# Patient Record
Sex: Female | Born: 1990 | Race: Black or African American | Hispanic: No | Marital: Single | Smoking: Never smoker
Health system: Southern US, Community
[De-identification: ages and names within clinical notes are randomized; demographics above are authoritative.]

## PROBLEM LIST (undated history)

## (undated) ENCOUNTER — Inpatient Hospital Stay (HOSPITAL_COMMUNITY): Payer: Self-pay

## (undated) DIAGNOSIS — A64 Unspecified sexually transmitted disease: Secondary | ICD-10-CM

## (undated) DIAGNOSIS — R87629 Unspecified abnormal cytological findings in specimens from vagina: Secondary | ICD-10-CM

## (undated) DIAGNOSIS — G43909 Migraine, unspecified, not intractable, without status migrainosus: Secondary | ICD-10-CM

## (undated) DIAGNOSIS — A5901 Trichomonal vulvovaginitis: Secondary | ICD-10-CM

## (undated) HISTORY — DX: Migraine, unspecified, not intractable, without status migrainosus: G43.909

## (undated) HISTORY — DX: Unspecified sexually transmitted disease: A64

## (undated) HISTORY — DX: Trichomonal vulvovaginitis: A59.01

---
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
## 2010-03-10 ENCOUNTER — Emergency Department (HOSPITAL_COMMUNITY): Admission: EM | Admit: 2010-03-10 | Discharge: 2010-03-10 | Payer: Self-pay | Admitting: Emergency Medicine

## 2010-07-14 LAB — POCT I-STAT, CHEM 8
BUN: 13 mg/dL (ref 6–23)
Calcium, Ion: 1.08 mmol/L — ABNORMAL LOW (ref 1.12–1.32)
Chloride: 100 mEq/L (ref 96–112)
Creatinine, Ser: 0.9 mg/dL (ref 0.4–1.2)
Glucose, Bld: 82 mg/dL (ref 70–99)
HCT: 46 % (ref 36.0–46.0)
Hemoglobin: 15.6 g/dL — ABNORMAL HIGH (ref 12.0–15.0)
Potassium: 3.6 mEq/L (ref 3.5–5.1)
Sodium: 136 mEq/L (ref 135–145)
TCO2: 26 mmol/L (ref 0–100)

## 2010-07-14 LAB — MONONUCLEOSIS SCREEN: Mono Screen: NEGATIVE

## 2010-07-14 LAB — DIFFERENTIAL
Basophils Absolute: 0 10*3/uL (ref 0.0–0.1)
Basophils Relative: 0 % (ref 0–1)
Eosinophils Absolute: 0 10*3/uL (ref 0.0–0.7)
Eosinophils Relative: 0 % (ref 0–5)
Lymphocytes Relative: 22 % (ref 12–46)
Lymphs Abs: 1.4 10*3/uL (ref 0.7–4.0)
Monocytes Absolute: 1 10*3/uL (ref 0.1–1.0)
Monocytes Relative: 16 % — ABNORMAL HIGH (ref 3–12)
Neutro Abs: 3.8 10*3/uL (ref 1.7–7.7)
Neutrophils Relative %: 62 % (ref 43–77)

## 2010-07-14 LAB — CBC
HCT: 42.6 % (ref 36.0–46.0)
Hemoglobin: 14.6 g/dL (ref 12.0–15.0)
MCH: 30 pg (ref 26.0–34.0)
MCHC: 34.3 g/dL (ref 30.0–36.0)
MCV: 87.4 fL (ref 78.0–100.0)
Platelets: 253 10*3/uL (ref 150–400)
RBC: 4.87 MIL/uL (ref 3.87–5.11)
RDW: 13.4 % (ref 11.5–15.5)
WBC: 6.2 10*3/uL (ref 4.0–10.5)

## 2011-01-26 LAB — POCT URINALYSIS DIP (DEVICE)
Bilirubin Urine: NEGATIVE
Glucose, UA: NEGATIVE
Nitrite: NEGATIVE
Operator id: 235561
Urobilinogen, UA: 0.2

## 2011-01-26 LAB — POCT PREGNANCY, URINE
Operator id: 235561
Preg Test, Ur: NEGATIVE

## 2012-05-06 ENCOUNTER — Emergency Department (HOSPITAL_COMMUNITY)
Admission: EM | Admit: 2012-05-06 | Discharge: 2012-05-06 | Disposition: A | Discharge status: Home / Self Care | Payer: BC Managed Care – PPO | Source: Home / Self Care | Attending: Family Medicine | Admitting: Family Medicine

## 2012-05-06 ENCOUNTER — Emergency Department (INDEPENDENT_AMBULATORY_CARE_PROVIDER_SITE_OTHER): Payer: BC Managed Care – PPO

## 2012-05-06 ENCOUNTER — Encounter (HOSPITAL_COMMUNITY): Payer: Self-pay | Admitting: Emergency Medicine

## 2012-05-06 ENCOUNTER — Telehealth (HOSPITAL_COMMUNITY): Payer: Self-pay | Admitting: Family Medicine

## 2012-05-06 DIAGNOSIS — J111 Influenza due to unidentified influenza virus with other respiratory manifestations: Secondary | ICD-10-CM

## 2012-05-06 MED ORDER — IPRATROPIUM BROMIDE 0.02 % IN SOLN
0.5000 mg | Freq: Once | RESPIRATORY_TRACT | Status: AC
  Administered 2012-05-06: 0.5 mg via RESPIRATORY_TRACT

## 2012-05-06 MED ORDER — ALBUTEROL SULFATE (5 MG/ML) 0.5% IN NEBU
5.0000 mg | INHALATION_SOLUTION | Freq: Once | RESPIRATORY_TRACT | Status: AC
  Administered 2012-05-06: 5 mg via RESPIRATORY_TRACT

## 2012-05-06 MED ORDER — HYDROCOD POLST-CHLORPHEN POLST 10-8 MG/5ML PO LQCR: 5.0000 mL | Freq: Two times a day (BID) | ORAL | Status: DC

## 2012-05-06 MED ORDER — ALBUTEROL SULFATE (5 MG/ML) 0.5% IN NEBU
INHALATION_SOLUTION | RESPIRATORY_TRACT | Status: AC
  Filled 2012-05-06: qty 0.5

## 2012-05-06 NOTE — ED Notes (Signed)
Patient called wanting to know when RX was going to be called in to pharmacy.  After reviewing chart patient was given a printed RX here.  Patient made aware that she had RX with her discharge paperwork.  Patient stated she found RX.

## 2012-05-06 NOTE — ED Provider Notes (Signed)
History     CSN: [id number]  Arrival date & time 05/06/12  1340   First MD Initiated Contact with Patient 05/06/12 1354      Chief Complaint  Patient presents with  . URI    (Consider location/radiation/quality/duration/timing/severity/associated sxs/prior treatment) Patient is a 22 y.o. female presenting with URI. The history is provided by the patient.  URI The primary symptoms include fever, cough and wheezing. The current episode started more than 1 week ago (sx for 2 weeks, getting worse, given amox by [facility] but had allergic rxn last week, now with flu like sx, has not had flu shot.). This is a new problem. The problem has been gradually worsening.  Symptoms associated with the illness include chills, congestion and rhinorrhea.    History reviewed. No pertinent past medical history.  History reviewed. No pertinent past surgical history.  No family history on file.  History  Substance Use Topics  . Smoking status: Never Smoker   . Smokeless tobacco: Not on file  . Alcohol Use: No    OB History    Grav Para Term Preterm Abortions TAB SAB Ect Mult Living                  Review of Systems  Constitutional: Positive for fever and chills.  HENT: Positive for congestion and rhinorrhea.   Respiratory: Positive for cough and wheezing.   Cardiovascular: Negative.   Gastrointestinal: Negative.     Allergies  Amoxicillin and Penicillins  Home Medications   Current Outpatient Rx  Name  Route  Sig  Dispense  Refill  . HYDROCOD POLST-CPM POLST ER 10-8 MG/5ML PO LQCR   Oral   Take 5 mLs by mouth every 12 (twelve) hours. As needed for cough   115 mL   1     BP 124/73  Pulse 112  Temp 100.3 F (37.9 C) (Oral)  Resp 18  SpO2 99%  LMP 04/19/2012  Physical Exam  Nursing note and vitals reviewed. Constitutional: She is oriented to person, place, and time. She appears well-developed and well-nourished.  HENT:  Head: Normocephalic.  Right Ear: External  ear normal.  Left Ear: External ear normal.  Mouth/Throat: Oropharynx is clear and moist.  Eyes: Conjunctivae normal are normal. Pupils are equal, round, and reactive to light.  Neck: Normal range of motion. Neck supple.  Cardiovascular: Normal rate, regular rhythm, normal heart sounds and intact distal pulses.   Pulmonary/Chest: No respiratory distress. She has wheezes. She has rhonchi.  Abdominal: Soft. Bowel sounds are normal. There is no tenderness.  Lymphadenopathy:    She has no cervical adenopathy.  Neurological: She is alert and oriented to person, place, and time.  Skin: Skin is warm and dry.    ED Course  Procedures (including critical care time)   Labs Reviewed  POCT RAPID STREP A ([facility] ONLY)   Dg Chest 2 View  05/06/2012  *RADIOLOGY REPORT*  Clinical Data: Cough and wheezing  CHEST - 2 VIEW  Comparison: None.  Findings: Cardiomediastinal silhouette is within normal limits. The lungs are clear. No pleural effusion.  No pneumothorax.  No acute osseous abnormality.  IMPRESSION: Normal chest.   Original Report Authenticated By: Christiana Pellant, M.D.      1. Influenza-like illness       MDM  X-rays reviewed and report per radiologist. Strep neg.         Linna Hoff, MD 05/06/12 [phone]

## 2012-05-06 NOTE — ED Notes (Signed)
Pt c/o cold sx x2 weeks. Sx include: sore throat, fevers, chills, cough w/dark yellow sputum, headache Denies: nauseas, vomiting, diarrhea  She is alert w/no signs of acute distress.

## 2012-08-11 ENCOUNTER — Encounter (HOSPITAL_COMMUNITY): Payer: Self-pay | Admitting: Emergency Medicine

## 2012-08-11 ENCOUNTER — Emergency Department (HOSPITAL_COMMUNITY): Payer: BC Managed Care – PPO

## 2012-08-11 ENCOUNTER — Emergency Department (HOSPITAL_COMMUNITY)
Admission: EM | Admit: 2012-08-11 | Discharge: 2012-08-11 | Disposition: A | Discharge status: Home / Self Care | Payer: BC Managed Care – PPO | Attending: Emergency Medicine | Admitting: Emergency Medicine

## 2012-08-11 DIAGNOSIS — Y9389 Activity, other specified: Secondary | ICD-10-CM | POA: Insufficient documentation

## 2012-08-11 DIAGNOSIS — S161XXA Strain of muscle, fascia and tendon at neck level, initial encounter: Secondary | ICD-10-CM

## 2012-08-11 DIAGNOSIS — S139XXA Sprain of joints and ligaments of unspecified parts of neck, initial encounter: Secondary | ICD-10-CM | POA: Insufficient documentation

## 2012-08-11 DIAGNOSIS — Y92410 Unspecified street and highway as the place of occurrence of the external cause: Secondary | ICD-10-CM | POA: Insufficient documentation
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
## 2012-09-26 ENCOUNTER — Encounter: Payer: Self-pay | Admitting: Certified Nurse Midwife

## 2012-09-27 ENCOUNTER — Ambulatory Visit: Payer: Self-pay | Admitting: Certified Nurse Midwife

## 2012-09-27 ENCOUNTER — Encounter: Payer: Self-pay | Admitting: Certified Nurse Midwife

## 2012-09-27 DIAGNOSIS — Z01419 Encounter for gynecological examination (general) (routine) without abnormal findings: Secondary | ICD-10-CM

## 2013-07-16 ENCOUNTER — Ambulatory Visit (INDEPENDENT_AMBULATORY_CARE_PROVIDER_SITE_OTHER): Payer: BC Managed Care – PPO | Admitting: Certified Nurse Midwife

## 2013-07-16 ENCOUNTER — Encounter: Payer: Self-pay | Admitting: Certified Nurse Midwife

## 2013-07-16 VITALS — BP 121/73 | HR 90 | Resp 16 | Ht 62.5 in | Wt 116.0 lb

## 2013-07-16 DIAGNOSIS — Z01419 Encounter for gynecological examination (general) (routine) without abnormal findings: Secondary | ICD-10-CM

## 2013-07-16 DIAGNOSIS — Z0000 Encounter for general adult medical examination without abnormal findings: Secondary | ICD-10-CM

## 2013-07-16 LAB — POCT URINALYSIS DIPSTICK
BILIRUBIN UA: NEGATIVE
GLUCOSE UA: NEGATIVE
Ketones, UA: NEGATIVE
Leukocytes, UA: NEGATIVE
Nitrite, UA: NEGATIVE
Protein, UA: NEGATIVE
RBC UA: NEGATIVE
UROBILINOGEN UA: NEGATIVE
pH, UA: 5

## 2013-07-16 LAB — CBC
HCT: 39.9 % (ref 36.0–46.0)
Hemoglobin: 13.5 g/dL (ref 12.0–15.0)
MCH: 29.6 pg (ref 26.0–34.0)
MCHC: 33.8 g/dL (ref 30.0–36.0)
MCV: 87.5 fL (ref 78.0–100.0)
PLATELETS: 354 10*3/uL (ref 150–400)
RBC: 4.56 MIL/uL (ref 3.87–5.11)
RDW: 14.4 % (ref 11.5–15.5)
WBC: 4.8 10*3/uL (ref 4.0–10.5)

## 2013-07-16 NOTE — Patient Instructions (Signed)
General topics  Next pap or exam is  due in 1 year Take a Women's multivitamin Take 1200 mg. of calcium daily - prefer dietary If any concerns in interim to call back  Breast Self-Awareness Practicing breast self-awareness may pick up problems early, prevent significant medical complications, and possibly save your life. By practicing breast self-awareness, you can become familiar with how your breasts look and feel and if your breasts are changing. This allows you to notice changes early. It can also offer you some reassurance that your breast health is good. One way to learn what is normal for your breasts and whether your breasts are changing is to do a breast self-exam. If you find a lump or something that was not present in the past, it is best to contact your caregiver right away. Other findings that should be evaluated by your caregiver include nipple discharge, especially if it is bloody; skin changes or reddening; areas where the skin seems to be pulled in (retracted); or new lumps and bumps. Breast pain is seldom associated with cancer (malignancy), but should also be evaluated by a caregiver. BREAST SELF-EXAM The best time to examine your breasts is 5 7 days after your menstrual period is over.  ExitCare Patient Information 2013 [employer].   Exercise to Stay Healthy Exercise helps you become and stay healthy. EXERCISE IDEAS AND TIPS Choose exercises that:  You enjoy.  Fit into your day. You do not need to exercise really hard to be healthy. You can do exercises at a slow or medium level and stay healthy. You can:  Stretch before and after working out.  Try yoga, Pilates, or tai chi.  Lift weights.  Walk fast, swim, jog, run, climb stairs, bicycle, dance, or rollerskate.  Take aerobic classes. Exercises that burn about 150 calories:  Running 1  miles in 15 minutes.  Playing volleyball for 45 to 60 minutes.  Washing and waxing a car for 45 to 60  minutes.  Playing touch football for 45 minutes.  Walking 1  miles in 35 minutes.  Pushing a stroller 1  miles in 30 minutes.  Playing basketball for 30 minutes.  Raking leaves for 30 minutes.  Bicycling 5 miles in 30 minutes.  Walking 2 miles in 30 minutes.  Dancing for 30 minutes.  Shoveling snow for 15 minutes.  Swimming laps for 20 minutes.  Walking up stairs for 15 minutes.  Bicycling 4 miles in 15 minutes.  Gardening for 30 to 45 minutes.  Jumping rope for 15 minutes.  Washing windows or floors for 45 to 60 minutes. Document Released: 05/22/2010 Document Revised: 07/12/2011 Document Reviewed: 05/22/2010 Exit[employer] Patient Information 2013 [employer].   Other topics ( that may be useful information):    Sexually Transmitted Disease Sexually transmitted disease (STD) refers to any infection that is passed from person to person during sexual activity. This may happen by way of saliva, semen, blood, vaginal mucus, or urine. Common STDs include:  Gonorrhea.  Chlamydia.  Syphilis.  HIV/AIDS.  Genital herpes.  Hepatitis B and C.  Trichomonas.  Human papillomavirus (HPV).  Pubic lice. CAUSES  An STD may be spread by bacteria, virus, or parasite. A person can get an STD by:  Sexual intercourse with an infected person.  Sharing sex toys with an infected person.  Sharing needles with an infected person.  Having intimate contact with the genitals, mouth, or rectal areas of an infected person. SYMPTOMS  Some people may not have any symptoms, but   they can still pass the infection to others. Different STDs have different symptoms. Symptoms include:  Painful or bloody urination.  Pain in the pelvis, abdomen, vagina, anus, throat, or eyes.  Skin rash, itching, irritation, growths, or sores (lesions). These usually occur in the genital or anal area.  Abnormal vaginal discharge.  Penile discharge in men.  Soft, flesh-colored skin growths in the  genital or anal area.  Fever.  Pain or bleeding during sexual intercourse.  Swollen glands in the groin area.  Yellow skin and eyes (jaundice). This is seen with hepatitis. DIAGNOSIS  To make a diagnosis, your caregiver may:  Take a medical history.  Perform a physical exam.  Take a specimen (culture) to be examined.  Examine a sample of discharge under a microscope.  Perform blood test TREATMENT   Chlamydia, gonorrhea, trichomonas, and syphilis can be cured with antibiotic medicine.  Genital herpes, hepatitis, and HIV can be treated, but not cured, with prescribed medicines. The medicines will lessen the symptoms.  Genital warts from HPV can be treated with medicine or by freezing, burning (electrocautery), or surgery. Warts may come back.  HPV is a virus and cannot be cured with medicine or surgery.However, abnormal areas may be followed very closely by your caregiver and may be removed from the cervix, vagina, or vulva through office procedures or surgery. If your diagnosis is confirmed, your recent sexual partners need treatment. This is true even if they are symptom-free or have a negative culture or evaluation. They should not have sex until their caregiver says it is okay. HOME CARE INSTRUCTIONS  All sexual partners should be informed, tested, and treated for all STDs.  Take your antibiotics as directed. Finish them even if you start to feel better.  Only take over-the-counter or prescription medicines for pain, discomfort, or fever as directed by your caregiver.  Rest.  Eat a balanced diet and drink enough fluids to keep your urine clear or pale yellow.  Do not have sex until treatment is completed and you have followed up with your caregiver. STDs should be checked after treatment.  Keep all follow-up appointments, Pap tests, and blood tests as directed by your caregiver.  Only use latex condoms and water-soluble lubricants during sexual activity. Do not use  petroleum jelly or oils.  Avoid alcohol and illegal drugs.  Get vaccinated for HPV and hepatitis. If you have not received these vaccines in the past, talk to your caregiver about whether one or both might be right for you.  Avoid risky sex practices that can break the skin. The only way to avoid getting an STD is to avoid all sexual activity.Latex condoms and dental dams (for oral sex) will help lessen the risk of getting an STD, but will not completely eliminate the risk. SEEK MEDICAL CARE IF:   You have a fever.  You have any new or worsening symptoms. Document Released: 07/10/2002 Document Revised: 07/12/2011 Document Reviewed: 07/17/2010 [facility] Patient Information 2013 [place].    Domestic Abuse You are being battered or abused if someone close to you hits, pushes, or physically hurts you in any way. You also are being abused if you are forced into activities. You are being sexually abused if you are forced to have sexual contact of any kind. You are being emotionally abused if you are made to feel worthless or if you are constantly threatened. It is important to remember that help is available. No one has the right to abuse you. PREVENTION OF FURTHER  ABUSE  Learn the warning signs of danger. This varies with situations but may include: the use of alcohol, threats, isolation from friends and family, or forced sexual contact. Leave if you feel that violence is going to occur.  If you are attacked or beaten, report it to the police so the abuse is documented. You do not have to press charges. The police can protect you while you or the attackers are leaving. Get the officer's name and badge number and a copy of the report.  Find someone you can trust and tell them what is happening to you: your caregiver, a nurse, clergy member, close friend or family member. Feeling ashamed is natural, but remember that you have done nothing wrong. No one deserves abuse. Document Released:  04/16/2000 Document Revised: 07/12/2011 Document Reviewed: 06/25/2010 [employer] Patient Information 2013 [employer].    How Much is Too Much Alcohol? Drinking too much alcohol can cause injury, accidents, and health problems. These types of problems can include:   Car crashes.  Falls.  Family fighting (domestic violence).  Drowning.  Fights.  Injuries.  Burns.  Damage to certain organs.  Having a baby with birth defects. ONE DRINK CAN BE TOO MUCH WHEN YOU ARE:  Working.  Pregnant or breastfeeding.  Taking medicines. Ask your doctor.  Driving or planning to drive. If you or someone you know has a drinking problem, get help from a doctor.  Document Released: 02/13/2009 Document Revised: 07/12/2011 Document Reviewed: 02/13/2009 ExitCare Patient Information 2013 [employer].   Smoking Hazards Smoking cigarettes is extremely bad for your health. Tobacco smoke has over 200 known poisons in it. There are over 60 chemicals in tobacco smoke that cause cancer. Some of the chemicals found in cigarette smoke include:   Cyanide.  Benzene.  Formaldehyde.  Methanol (wood alcohol).  Acetylene (fuel used in welding torches).  Ammonia. Cigarette smoke also contains the poisonous gases nitrogen oxide and carbon monoxide.  Cigarette smokers have an increased risk of many serious medical problems and Smoking causes approximately:  90% of all lung cancer deaths in men.  80% of all lung cancer deaths in women.  90% of deaths from chronic obstructive lung disease. Compared with nonsmokers, smoking increases the risk of:  Coronary heart disease by 2 to 4 times.  Stroke by 2 to 4 times.  Men developing lung cancer by 23 times.  Women developing lung cancer by 13 times.  Dying from chronic obstructive lung diseases by 12 times.  . Smoking is the most preventable cause of death and disease in our society.  WHY IS SMOKING ADDICTIVE?  Nicotine is the chemical  agent in tobacco that is capable of causing addiction or dependence.  When you smoke and inhale, nicotine is absorbed rapidly into the bloodstream through your lungs. Nicotine absorbed through the lungs is capable of creating a powerful addiction. Both inhaled and non-inhaled nicotine may be addictive.  Addiction studies of cigarettes and spit tobacco show that addiction to nicotine occurs mainly during the teen years, when young people begin using tobacco products. WHAT ARE THE BENEFITS OF QUITTING?  There are many health benefits to quitting smoking.   Likelihood of developing cancer and heart disease decreases. Health improvements are seen almost immediately.  Blood pressure, pulse rate, and breathing patterns start returning to normal soon after quitting. QUITTING SMOKING   American Lung Association - 1-800-LUNGUSA  American Cancer Society - 1-800-ACS-2345 Document Released: 05/27/2004 Document Revised: 07/12/2011 Document Reviewed: 01/29/2009 ExitCare Patient Information 2013 [employer].   Stress Management Stress is a state of physical or mental tension that often results from changes in your life or normal routine. Some common causes of stress are:  Death of a loved one.  Injuries or severe illnesses.  Getting fired or changing jobs.  Moving into a new home. Other causes may be:  Sexual problems.  Business or financial losses.  Taking on a large debt.  Regular conflict with someone at home or at work.  Constant tiredness from lack of sleep. It is not just bad things that are stressful. It may be stressful to:  Win the lottery.  Get married.  Buy a new car. The amount of stress that can be easily tolerated varies from person to person. Changes generally cause stress, regardless of the types of change. Too much stress can affect your health. It may lead to physical or emotional problems. Too little stress (boredom) may also become stressful. SUGGESTIONS TO  REDUCE STRESS:  Talk things over with your family and friends. It often is helpful to share your concerns and worries. If you feel your problem is serious, you may want to get help from a professional counselor.  Consider your problems one at a time instead of lumping them all together. Trying to take care of everything at once may seem impossible. List all the things you need to do and then start with the most important one. Set a goal to accomplish 2 or 3 things each day. If you expect to do too many in a single day you will naturally fail, causing you to feel even more stressed.  Do not use alcohol or drugs to relieve stress. Although you may feel better for a short time, they do not remove the problems that caused the stress. They can also be habit forming.  Exercise regularly - at least 3 times per week. Physical exercise can help to relieve that "uptight" feeling and will relax you.  The shortest distance between despair and hope is often a good night's sleep.  Go to bed and get up on time allowing yourself time for appointments without being rushed.  Take a short "time-out" period from any stressful situation that occurs during the day. Close your eyes and take some deep breaths. Starting with the muscles in your face, tense them, hold it for a few seconds, then relax. Repeat this with the muscles in your neck, shoulders, hand, stomach, back and legs.  Take good care of yourself. Eat a balanced diet and get plenty of rest.  Schedule time for having fun. Take a break from your daily routine to relax. HOME CARE INSTRUCTIONS   Call if you feel overwhelmed by your problems and feel you can no longer manage them on your own.  Return immediately if you feel like hurting yourself or someone else. Document Released: 10/13/2000 Document Revised: 07/12/2011 Document Reviewed: 06/05/2007 [facility] Patient Information 2013 [place].  Good to see you today.  Consistent condom use. Try  Epsom Salt tub bath for the shaving bumps. Have a great spring ! Debbi

## 2013-07-16 NOTE — Progress Notes (Signed)
[age] y.o. G0P0000 Single African American Fe here for annual exam. Periods normal, no issues. Condoms for contraception, working well. Desires STD screening due to partner change. Patient desires Depo Provera again for contraception. She worries about condom breakage. Patient also complaining of "shaving bumps". She shaves every 2 weeks and wants to be totally smooth in pubic area. Sees PCP prn. No other health issues today. Busy with work and college!  Patient's last menstrual period was 07/02/2013.          Sexually active: yes  The current method of family planning is condoms sometimes.    Exercising: no  exercise Smoker:  no  Health Maintenance: Pap: 09-22-11 neg MMG:  none Colonoscopy:  none BMD:  none TDaP:  2010 Labs: Poct urine-neg Self breast exam: done occ   reports that she has never smoked. She does not have any smokeless tobacco history on file. She reports that she drinks about 0.5 ounces of alcohol per week. She reports that she does not use illicit drugs.  Past Medical History  Diagnosis Date  . Migraines   . STD (sexually transmitted disease)     chlamydia treated 2011    Past Surgical History  Procedure Laterality Date  . Colposcopy  5/09    [facility]    Current Outpatient Prescriptions  Medication Sig Dispense Refill  . Multiple Vitamins-Minerals (MULTIVITAMIN PO) Take by mouth daily.       No current facility-administered medications for this visit.    Family History  Problem Relation Age of Onset  . Hypertension Maternal Grandmother   . Hypertension Paternal Grandmother     ROS:  Pertinent items are noted in HPI.  Otherwise, a comprehensive ROS was negative.  Exam:   BP 121/73  Pulse 90  Resp 16  Ht 5' 2.5" (1.588 m)  Wt 116 lb (52.617 kg)  BMI 20.87 kg/m2  LMP 07/02/2013 Height: 5' 2.5" (158.8 cm)  Ht Readings from Last 3 Encounters:  07/16/13 5' 2.5" (1.588 m)    General appearance: alert, cooperative and appears stated age Head:  Normocephalic, without obvious abnormality, atraumatic Neck: no adenopathy, supple, symmetrical, trachea midline and thyroid normal to inspection and palpation and non-palpable Lungs: clear to auscultation bilaterally Breasts: normal appearance, no masses or tenderness, No nipple retraction or dimpling, No nipple discharge or bleeding, No axillary or supraclavicular adenopathy Heart: regular rate and rhythm Abdomen: soft, non-tender; no masses,  no organomegaly Extremities: extremities normal, atraumatic, no cyanosis or edema Skin: Skin color, texture, turgor normal. No rashes or lesions Lymph nodes: Cervical, supraclavicular, and axillary nodes normal. No abnormal inguinal nodes palpated Neurologic: Grossly normal   Pelvic: External genitalia:  no lesions, mons area numerous papules from shaving, no pustules noted.              Urethra:  normal appearing urethra with no masses, tenderness or lesions              Bartholin's and Skene's: normal                 Vagina: normal appearing vagina with normal color and discharge, no lesions              Cervix: normal, non tender              Pap taken: yes Bimanual Exam:  Uterus:  normal size, contour, position, consistency, mobility, non-tender and anteverted              Adnexa: normal adnexa and  no mass, fullness, tenderness               Rectovaginal: Confirms               Anus:  Deferred  A:  Well Woman with normal exam  Contraception condoms desires Depo Provera again  STD screening  Pubic shaving papules  P:   Reviewed health and wellness pertinent to exam  Patient aware of bleeding profile. Patient will call when on next period day 1-5 to schedule appointment to restart Depo Provera.  Rx Depo Provera 150 mg IM every 3 months x 1 year. Lab:HIV,RPR,GC,Chlamydia   Discussed using laser hair removal if possible, if not avoid severe shaving to let the skin have less irritation. Use epsom salt tub bath after shaving to avoid papule  formation. Patient will try and see  If this resolves, will advise.  Pap smear as per guidelines   pap smear taken today with reflex  counseled on breast self exam, STD prevention, HIV risk factors and prevention, family planning choices, adequate intake of calcium and vitamin D, diet and exercise  return annually or prn  An After Visit Summary was printed and given to the patient.

## 2013-07-17 LAB — RPR

## 2013-07-17 LAB — HIV ANTIBODY (ROUTINE TESTING W REFLEX): HIV: NONREACTIVE

## 2013-07-18 LAB — IPS N GONORRHOEA AND CHLAMYDIA BY PCR

## 2013-07-18 LAB — IPS PAP TEST WITH REFLEX TO HPV

## 2013-07-18 NOTE — Progress Notes (Signed)
Reviewed personally.  M. Suzanne Lether Tesch, MD.  

## 2013-07-23 ENCOUNTER — Telehealth: Payer: Self-pay | Admitting: Certified Nurse Midwife

## 2013-07-23 NOTE — Telephone Encounter (Signed)
Pt given pap results & pt is aware to make sure she has pap done in 1 yr & the importance. Pt was also explained to as to why no f/u is done now

## 2013-07-23 NOTE — Telephone Encounter (Signed)
Message copied by Eliezer BottomJOHNSON, Hasana Alcorta J on Mon Jul 23, 2013  2:00 PM ------      Message from: Verner CholLEONARD, DEBORAH S      Created: Mon Jul 23, 2013  8:57 AM       Notify patient that Pap smear LSIL again as she had in the past. No further evaluation at this time. Important to repeat pap in one year to see if cleared or progressed. Due to your age this can spontaneous clear.      08 ------

## 2013-07-23 NOTE — Telephone Encounter (Signed)
Patient called during lunch said she was calling joy back.

## 2013-07-23 NOTE — Telephone Encounter (Signed)
Patient says she is returning a call to PatrickJoy.?

## 2013-12-13 ENCOUNTER — Encounter: Payer: Self-pay | Admitting: Certified Nurse Midwife

## 2013-12-13 ENCOUNTER — Ambulatory Visit (INDEPENDENT_AMBULATORY_CARE_PROVIDER_SITE_OTHER): Payer: BC Managed Care – PPO | Admitting: Certified Nurse Midwife

## 2013-12-13 VITALS — BP 110/60 | HR 68 | Resp 16 | Ht 62.5 in | Wt 114.0 lb

## 2013-12-13 DIAGNOSIS — Z202 Contact with and (suspected) exposure to infections with a predominantly sexual mode of transmission: Secondary | ICD-10-CM

## 2013-12-13 DIAGNOSIS — N912 Amenorrhea, unspecified: Secondary | ICD-10-CM

## 2013-12-13 DIAGNOSIS — Z309 Encounter for contraceptive management, unspecified: Secondary | ICD-10-CM

## 2013-12-13 LAB — POCT URINE PREGNANCY: PREG TEST UR: NEGATIVE

## 2013-12-13 NOTE — Progress Notes (Signed)
  23 y.o.Single Caucasian  female presents with no menses sinceJuly 1,2015, approximate. Patient here to discuss contraception and have STD screening. Patient desires Depo Provera again, previous use and no weight gain. Patient had light period in July, but negative UPT today.  Desires all STD screening, trust issues with partner. No change in vaginal discharge. Using condoms for contraception at present. No other health issues today.  O: Healthy WDWN female Affect normal Last aex normal here, 3/15  Exam: Abdomen soft , non tender External genital: normal no lesions, hair bumps noted ZOX:[id number]:negative Vagina:normal discharge, appearance Cervix non tender, normal Uterus non tender, normal Adnexa non tender, no masses, normal  Assessment: Normal pelvic exam Contraception desired STD screening  Plan: Discussed findings of normal exam Patient to call with next period to come in for Depo Provera, condom use consistent. RX Depo Provera 150 mg IM every 3 months x 3 Lab:GC,Chlamydia,HIV,RPR,Hep.C, HSV 1,2  Rv prn as above

## 2013-12-13 NOTE — Patient Instructions (Signed)

## 2013-12-14 LAB — HEPATITIS C ANTIBODY: HCV Ab: NEGATIVE

## 2013-12-14 LAB — RPR

## 2013-12-14 LAB — HIV ANTIBODY (ROUTINE TESTING W REFLEX): HIV: NONREACTIVE

## 2013-12-14 NOTE — Progress Notes (Signed)
Reviewed personally.  M. Suzanne Etana Beets, MD.  

## 2013-12-15 LAB — IPS N GONORRHOEA AND CHLAMYDIA BY PCR

## 2013-12-17 LAB — HSV(HERPES SIMPLEX VRS) I + II AB-IGG
HSV 1 Glycoprotein G Ab, IgG: 0.82 IV
HSV 2 Glycoprotein G Ab, IgG: 13.76 IV — ABNORMAL HIGH

## 2013-12-18 ENCOUNTER — Telehealth: Payer: Self-pay

## 2013-12-18 ENCOUNTER — Encounter: Payer: Self-pay | Admitting: *Deleted

## 2013-12-18 NOTE — Telephone Encounter (Signed)
Verner Choleborah S. Leonard CNM unable to reach patient at number provided and per ROI unable to contact other number listed as patient wishes not to share health information with anyone. Patient is also not mychart active. Certified letter created and sent. Will keep in hold to make sure we get in contact with her.

## 2013-12-18 NOTE — Telephone Encounter (Signed)
Tried to reach patient that the number provided [phone]. Recording states that number has been changed or is no longer in service.

## 2013-12-21 ENCOUNTER — Telehealth: Payer: Self-pay | Admitting: Obstetrics and Gynecology

## 2013-12-21 ENCOUNTER — Emergency Department (INDEPENDENT_AMBULATORY_CARE_PROVIDER_SITE_OTHER)
Admission: EM | Admit: 2013-12-21 | Discharge: 2013-12-21 | Disposition: A | Discharge status: Home / Self Care | Payer: BC Managed Care – PPO | Source: Home / Self Care | Attending: Family Medicine | Admitting: Family Medicine

## 2013-12-21 ENCOUNTER — Ambulatory Visit: Payer: BC Managed Care – PPO | Admitting: Obstetrics and Gynecology

## 2013-12-21 ENCOUNTER — Encounter (HOSPITAL_COMMUNITY): Payer: Self-pay | Admitting: Emergency Medicine

## 2013-12-21 DIAGNOSIS — B009 Herpesviral infection, unspecified: Secondary | ICD-10-CM

## 2013-12-21 NOTE — ED Notes (Signed)
C/o herpes breaks out; PCP dx recently Was supposed to go to PCP for tx but missed appt Alert, no signs of acute distress; tearful

## 2013-12-21 NOTE — Telephone Encounter (Signed)
This is debbis pt she was scheduled this morning to talk about some results. Pt came in 15 min past appt time rs her to Monday with debbi.

## 2013-12-21 NOTE — Telephone Encounter (Signed)
Thanks for the update

## 2013-12-21 NOTE — Telephone Encounter (Signed)
Spoke with patient. Results given as seen below. Patient would like to come in today to discuss results. Advised that Verner Choleborah S. Leonard CNM is out of the office but that I could get her in to see a different provider. Patient is agreeable. Appointment scheduled with Dr.Silva for today at 9:30am. Patient agreeable to date and time.  Routing to Dr.Silva Cc: Verner Choleborah S. 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 Patient agreeable to disposition. Will close encounter

## 2013-12-21 NOTE — ED Provider Notes (Signed)
Teresa Velez is a 23 y.o. 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 Patient had routine screening for sexually transmitted diseases performed by her primary care provider one week ago. 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 She was asymptomatic and presents to clinic today for treatment.   Past Medical History  Diagnosis Date  . Migraines   . STD (sexually transmitted disease)     chlamydia treated 2011   History  Substance Use Topics  . Smoking status: Never Smoker   . Smokeless tobacco: Not on file  . Alcohol Use: 1.0 - 1.5 oz/week    2-3 drink(s) per week     Comment: none in past month   ROS as above Medications: No current facility-administered medications for this encounter.   Current Outpatient Prescriptions  Medication Sig Dispense Refill  . Multiple Vitamins-Minerals (MULTIVITAMIN PO) Take by mouth daily.        Exam:  BP 132/89  Pulse 74  Temp(Src) 98.6 F (37 C) (Oral)  Resp 16  SpO2 100%  LMP 12/16/2013 Gen: Well NAD   No results found for this or any previous visit (from the past 24 hour(s)). No results found.  Assessment and Plan: 23 y.o. female with a lengthy discussion about the nature of this test. She is currently asymptomatic. There is no indication for treatment at this time. Recommend followup with her primary care provider as needed.  Discussed warning signs or symptoms. Please see discharge instructions. Patient expresses understanding.   This note was created using Conservation officer, historic buildingsDragon voice recognition software. Any transcription errors are unintended.    Rodolph BongEvan S Janicia Monterrosa, MD 12/21/13 1420

## 2013-12-21 NOTE — Discharge Instructions (Signed)
Thank you for coming in today. Come back as needed.   Genital Herpes Genital herpes is a sexually transmitted disease. This means that it is a disease passed by having sex with an infected person. There is no cure for genital herpes. The time between attacks can be months to years. The virus may live in a person but produce no problems (symptoms). This infection can be passed to a baby as it travels down the birth canal (vagina). In a newborn, this can cause central nervous system damage, eye damage, or even death. The virus that causes genital herpes is usually HSV-2 virus. The virus that causes oral herpes is usually HSV-1. The diagnosis (learning what is wrong) is made through culture results. SYMPTOMS  Usually symptoms of pain and itching begin a few days to a week after contact. It first appears as small blisters that progress to small painful ulcers which then scab over and heal after several days. It affects the outer genitalia, birth canal, cervix, penis, anal area, buttocks, and thighs. HOME CARE INSTRUCTIONS   Keep ulcerated areas dry and clean.  Take medications as directed. Antiviral medications can speed up healing. They will not prevent recurrences or cure this infection. These medications can also be taken for suppression if there are frequent recurrences.  While the infection is active, it is contagious. Avoid all sexual contact during active infections.  Condoms may help prevent spread of the herpes virus.  Practice safe sex.  Wash your hands thoroughly after touching the genital area.  Avoid touching your eyes after touching your genital area.  Inform your caregiver if you have had genital herpes and become pregnant. It is your responsibility to insure a safe outcome for your baby in this pregnancy.  Only take over-the-counter or prescription medicines for pain, discomfort, or fever as directed by your caregiver. SEEK MEDICAL CARE IF:   You have a recurrence of this  infection.  You do not respond to medications and are not improving.  You have new sources of pain or discharge which have changed from the original infection.  You have an oral temperature above 102 F (38.9 C).  You develop abdominal pain.  You develop eye pain or signs of eye infection. Document Released: 04/16/2000 Document Revised: 07/12/2011 Document Reviewed: 05/07/2009 [facility] Patient Information 2015 [place], [place]. This information is not intended to replace advice given to you by your health care provider. Make sure you discuss any questions you have with your health care provider.

## 2013-12-24 ENCOUNTER — Institutional Professional Consult (permissible substitution): Payer: BC Managed Care – PPO | Admitting: Certified Nurse Midwife

## 2013-12-24 ENCOUNTER — Encounter: Payer: Self-pay | Admitting: Certified Nurse Midwife

## 2014-01-28 ENCOUNTER — Telehealth: Payer: Self-pay | Admitting: Certified Nurse Midwife

## 2014-01-28 NOTE — Telephone Encounter (Signed)
Patient is asking to talk with Sara Chu about her most recent results.

## 2014-01-29 NOTE — Telephone Encounter (Signed)
Routing to Verner Choleborah S. Leonard CNM to review and advise.  Patient requesting to speak with provider.  Missed appointment to discuss results, recent HSV diagnosis-patient was late to appointment.  Patient has since been seen in urgent care and requesting treatment.

## 2014-01-31 ENCOUNTER — Telehealth: Payer: Self-pay | Admitting: Certified Nurse Midwife

## 2014-01-31 NOTE — Telephone Encounter (Signed)
Phone call to patient regarding HSV 2 positive results from recent STD screening. Patient came in and thought she just came to talk not have office visit and did not know there would be a charge, so she left after arriving late, per patient. Reviewed with patient results and answered questions regarding transmission, suppression and etiology of HSV 2. Questions addressed. Partner admitted he had been exposed to something but unsure what. Patient recently seen in another practice for repeat screening. Patient appreciated call. Rv prn

## 2014-01-31 NOTE — Telephone Encounter (Signed)
Patient spoke with Verner Choleborah S. Leonard CNM today. See additional telephone encounter. Will close encounter.

## 2014-04-17 ENCOUNTER — Encounter: Payer: Self-pay | Admitting: Certified Nurse Midwife

## 2014-06-26 ENCOUNTER — Ambulatory Visit (INDEPENDENT_AMBULATORY_CARE_PROVIDER_SITE_OTHER): Payer: BLUE CROSS/BLUE SHIELD | Admitting: Nurse Practitioner

## 2014-06-26 ENCOUNTER — Telehealth: Payer: Self-pay | Admitting: Certified Nurse Midwife

## 2014-06-26 ENCOUNTER — Encounter: Payer: Self-pay | Admitting: Nurse Practitioner

## 2014-06-26 VITALS — BP 120/66 | HR 88 | Temp 99.0°F | Ht 62.5 in | Wt 115.0 lb

## 2014-06-26 DIAGNOSIS — Z113 Encounter for screening for infections with a predominantly sexual mode of transmission: Secondary | ICD-10-CM

## 2014-06-26 DIAGNOSIS — R1030 Lower abdominal pain, unspecified: Secondary | ICD-10-CM

## 2014-06-26 LAB — CBC WITH DIFFERENTIAL/PLATELET
BASOS ABS: 0.1 10*3/uL (ref 0.0–0.1)
BASOS PCT: 1 % (ref 0–1)
Eosinophils Absolute: 0.1 10*3/uL (ref 0.0–0.7)
Eosinophils Relative: 1 % (ref 0–5)
HCT: 41 % (ref 36.0–46.0)
Hemoglobin: 13.9 g/dL (ref 12.0–15.0)
LYMPHS PCT: 46 % (ref 12–46)
Lymphs Abs: 2.6 10*3/uL (ref 0.7–4.0)
MCH: 29.9 pg (ref 26.0–34.0)
MCHC: 33.9 g/dL (ref 30.0–36.0)
MCV: 88.2 fL (ref 78.0–100.0)
MPV: 10 fL (ref 8.6–12.4)
Monocytes Absolute: 0.4 10*3/uL (ref 0.1–1.0)
Monocytes Relative: 7 % (ref 3–12)
NEUTROS ABS: 2.6 10*3/uL (ref 1.7–7.7)
NEUTROS PCT: 45 % (ref 43–77)
PLATELETS: 367 10*3/uL (ref 150–400)
RBC: 4.65 MIL/uL (ref 3.87–5.11)
RDW: 14.3 % (ref 11.5–15.5)
WBC: 5.7 10*3/uL (ref 4.0–10.5)

## 2014-06-26 NOTE — Telephone Encounter (Signed)
Call to patient. States that she woke up this morning out of her sleep with lower pelvic cramping. Felt like spasms that were coming 3-5 minutes apart. Patient states she is scared regarding incident. No pain now. No vaginal bleeding. Patient is sexually active and not using contraception. Current partner has exposed her to STD's in the past and patient requests STD testing as well. Patient unsure of exact date of LMP feels it was 2/10 or 2/11 but notes that cycle was late for her.  No changes in partners. No change in bowel or bladder habits. No fevers or discharge. Patient requests appointment today, office visit with Lauro FranklinPatricia Rolen-Grubb, FNP given for today. Patient agreeable. She will call back as needed.  Routing to provider for final review. Patient agreeable to disposition. Will close encounter

## 2014-06-26 NOTE — Progress Notes (Signed)
Subjective:     Patient ID: Teresa Velez, female   DOB: Oct 28, 1990, [age] y.o.   MRN: [id number]  HPI This 24 yo SAA Fe presents with lower pelvic pain with sudden onset at 8:00 this am.  It was right lower quadrant then became across the entire lower abdomen.  Went away when she got up and did not seem to be related to bladder function.  Came back for a short time when she ate breakfast.  Did not cause nausea or loss of appetite.  She describes the pain as 'spasmodic' that were coming 3-5 minutes apart.  No pain since this am. No vaginal bleeding. Patient is sexually active and not using contraception. Off birth control for over a year.  She was to return to start on Depo and now states she was not sure Ms. Eunice BlaseDebbie has given the order. Next [facility] is due 3/16.   Her current partner has been unfaithfull and she is concerned about expose to STD's and requests STD testing as well.  Patient believes her LMP was 2/10 or 2/11 but notes that cycle was late for her. Since off OCP cycles are irregular at 27 - 32 days. No change in bowel or bladder habits. Last BM this am and was normal.   No fevers or chills.     Review of Systems  Constitutional: Negative for fever, chills, fatigue and unexpected weight change.  Respiratory: Negative.   Cardiovascular: Negative.   Gastrointestinal: Negative.  Negative for nausea, vomiting, abdominal pain, diarrhea, constipation, abdominal distention and anal bleeding.  Genitourinary: Positive for vaginal discharge, menstrual problem and pelvic pain. Negative for dysuria, urgency, frequency, hematuria, flank pain, decreased urine volume, vaginal bleeding, genital sores, vaginal pain and dyspareunia.  Musculoskeletal: Negative.   Skin: Negative.   Neurological: Negative.   Psychiatric/Behavioral: Negative.        Objective:   Physical Exam  Constitutional: She is oriented to person, place, and time. She appears well-developed and well-nourished. No distress.   Abdominal: Soft. Bowel sounds are normal. She exhibits no distension and no mass. There is no tenderness. There is no rebound and no guarding.  Genitourinary:  Normal vaginal discharge. No adnexal tenderness other that slight discomfort with palpation.  No mass. Specimen for GC/ chl and Affirm is done.   UPT is negative.  Neurological: She is alert and oriented to person, place, and time.  Psychiatric: She has a normal mood and affect. Her behavior is normal. Judgment and thought content normal.       Assessment:     Lower pelvic pain - that may be related to mid cycle R/O STD's History of irregular menses off OCP R/O UTI    Plan:     Will follow labs: Urine C&S with Micro CBC with diff Affirm and GC & Chl She is to call back if symptoms worsen in any way.

## 2014-06-26 NOTE — Telephone Encounter (Signed)
Patient calling requesting to speak to the nurse and schedule an appointment for "severe abdominal cramping."

## 2014-06-26 NOTE — Patient Instructions (Signed)
We will call you with test results

## 2014-06-27 ENCOUNTER — Telehealth: Payer: Self-pay | Admitting: *Deleted

## 2014-06-27 ENCOUNTER — Other Ambulatory Visit: Payer: Self-pay | Admitting: Nurse Practitioner

## 2014-06-27 LAB — URINALYSIS, MICROSCOPIC ONLY
Casts: NONE SEEN
Crystals: NONE SEEN

## 2014-06-27 LAB — URINE CULTURE

## 2014-06-27 LAB — WET PREP BY MOLECULAR PROBE
CANDIDA SPECIES: NEGATIVE
Gardnerella vaginalis: POSITIVE — AB
TRICHOMONAS VAG: NEGATIVE

## 2014-06-27 MED ORDER — METRONIDAZOLE 0.75 % VA GEL: 1.0000 | Freq: Every day | VAGINAL | Status: DC

## 2014-06-27 NOTE — Telephone Encounter (Signed)
-----   Message from Lauro FranklinPatricia Rolen-Grubb, FNP sent at 06/27/2014  8:39 AM EST ----- Please let patient know that CBC is normal.  Urine micro is normal.  Wet prep does show positive BV and treatment will be sent to her pharmacy.  None of the  test for STD's is back and the urine culture is not back.

## 2014-06-27 NOTE — Progress Notes (Signed)
Encounter reviewed by Dr. Brook Silva.  

## 2014-06-27 NOTE — Telephone Encounter (Signed)
Left Message To Call Back  

## 2014-06-29 LAB — IPS N GONORRHOEA AND CHLAMYDIA BY PCR

## 2014-07-01 NOTE — Telephone Encounter (Signed)
Patient notified 06/27/14 (see result note)

## 2014-07-03 ENCOUNTER — Telehealth: Payer: Self-pay | Admitting: *Deleted

## 2014-07-03 NOTE — Telephone Encounter (Signed)
I have attempted to contact this patient by phone with the following results:  Voicemail box has not been set up.  Unable to leave message.  I will continue to try later.[phone] (Mobile) *Preferred*  See result note for more instructions.

## 2014-07-03 NOTE — Telephone Encounter (Signed)
-----   Message from Lauro FranklinPatricia Rolen-Grubb, FNP sent at 07/01/2014  2:35 PM EST ----- They were to be done at [facility] that is coming up in March.  We discussed this at time of OV.

## 2014-07-12 NOTE — Telephone Encounter (Signed)
Pt notified in result note.  Closing encounter. 

## 2014-07-18 ENCOUNTER — Ambulatory Visit: Payer: BC Managed Care – PPO | Admitting: Certified Nurse Midwife

## 2014-07-23 ENCOUNTER — Encounter: Payer: Self-pay | Admitting: Certified Nurse Midwife

## 2014-07-23 ENCOUNTER — Ambulatory Visit (INDEPENDENT_AMBULATORY_CARE_PROVIDER_SITE_OTHER): Payer: BLUE CROSS/BLUE SHIELD | Admitting: Certified Nurse Midwife

## 2014-07-23 VITALS — BP 100/60 | HR 70 | Resp 16 | Ht 62.75 in | Wt 114.0 lb

## 2014-07-23 DIAGNOSIS — Z0000 Encounter for general adult medical examination without abnormal findings: Secondary | ICD-10-CM | POA: Diagnosis not present

## 2014-07-23 DIAGNOSIS — Z30013 Encounter for initial prescription of injectable contraceptive: Secondary | ICD-10-CM

## 2014-07-23 DIAGNOSIS — Z01419 Encounter for gynecological examination (general) (routine) without abnormal findings: Secondary | ICD-10-CM

## 2014-07-23 DIAGNOSIS — N912 Amenorrhea, unspecified: Secondary | ICD-10-CM | POA: Diagnosis not present

## 2014-07-23 DIAGNOSIS — Z124 Encounter for screening for malignant neoplasm of cervix: Secondary | ICD-10-CM | POA: Diagnosis not present

## 2014-07-23 LAB — POCT URINALYSIS DIPSTICK
Leukocytes, UA: NEGATIVE
UROBILINOGEN UA: NEGATIVE
pH, UA: 7

## 2014-07-23 LAB — POCT URINE PREGNANCY: Preg Test, Ur: NEGATIVE

## 2014-07-23 LAB — HEMOGLOBIN, FINGERSTICK: Hemoglobin, fingerstick: 13.4 g/dL (ref 12.0–16.0)

## 2014-07-23 LAB — HIV ANTIBODY (ROUTINE TESTING W REFLEX): HIV: NONREACTIVE

## 2014-07-23 MED ORDER — MEDROXYPROGESTERONE ACETATE 150 MG/ML IM SUSP: 150.0000 mg | INTRAMUSCULAR | Status: DC

## 2014-07-23 NOTE — Patient Instructions (Signed)
Bacterial Vaginosis Bacterial vaginosis is a vaginal infection that occurs when the normal balance of bacteria in the vagina is disrupted. It results from an overgrowth of certain bacteria. This is the most common vaginal infection in women of childbearing age. Treatment is important to prevent complications, especially in pregnant women, as it can cause a premature delivery. CAUSES  Bacterial vaginosis is caused by an increase in harmful bacteria that are normally present in smaller amounts in the vagina. Several different kinds of bacteria can cause bacterial vaginosis. However, the reason that the condition develops is not fully understood. RISK FACTORS Certain activities or behaviors can put you at an increased risk of developing bacterial vaginosis, including:  Having a new sex partner or multiple sex partners.  Douching.  Using an intrauterine device (IUD) for contraception. Women do not get bacterial vaginosis from toilet seats, bedding, swimming pools, or contact with objects around them. SIGNS AND SYMPTOMS  Some women with bacterial vaginosis have no signs or symptoms. Common symptoms include:  Grey vaginal discharge.  A fishlike odor with discharge, especially after sexual intercourse.  Itching or burning of the vagina and vulva.  Burning or pain with urination. DIAGNOSIS  Your health care provider will take a medical history and examine the vagina for signs of bacterial vaginosis. A sample of vaginal fluid may be taken. Your health care provider will look at this sample under a microscope to check for bacteria and abnormal cells. A vaginal pH test may also be done.  TREATMENT  Bacterial vaginosis may be treated with antibiotic medicines. These may be given in the form of a pill or a vaginal cream. A second round of antibiotics may be prescribed if the condition comes back after treatment.  HOME CARE INSTRUCTIONS   Only take over-the-counter or prescription medicines as  directed by your health care provider.  If antibiotic medicine was prescribed, take it as directed. Make sure you finish it even if you start to feel better.  Do not have sex until treatment is completed.  Tell all sexual partners that you have a vaginal infection. They should see their health care provider and be treated if they have problems, such as a mild rash or itching.  Practice safe sex by using condoms and only having one sex partner. SEEK MEDICAL CARE IF:   Your symptoms are not improving after 3 days of treatment.  You have increased discharge or pain.  You have a fever. MAKE SURE YOU:   Understand these instructions.  Will watch your condition.  Will get help right away if you are not doing well or get worse. FOR MORE INFORMATION  Centers for Disease Control and Prevention, Division of STD Prevention: AppraiserFraud.fi American Sexual Health Association (ASHA): www.ashastd.org  Document Released: 04/19/2005 Document Revised: 02/07/2013 Document Reviewed: 11/29/2012 [facility] Patient Information 2015 [place], [place]. This information is not intended to replace advice given to you by your health care provider. Make sure you discuss any questions you have with your health care provider.       General topics  Next pap or exam is  due in 1 year Take a Women's multivitamin Take 1200 mg. of calcium daily - prefer dietary If any concerns in interim to call back  Breast Self-Awareness Practicing breast self-awareness may pick up problems early, prevent significant medical complications, and possibly save your life. By practicing breast self-awareness, you can become familiar with how your breasts look and feel and if your breasts are changing. This allows you to  notice changes early. It can also offer you some reassurance that your breast health is good. One way to learn what is normal for your breasts and whether your breasts are changing is to do a breast self-exam. If you  find a lump or something that was not present in the past, it is best to contact your caregiver right away. Other findings that should be evaluated by your caregiver include nipple discharge, especially if it is bloody; skin changes or reddening; areas where the skin seems to be pulled in (retracted); or new lumps and bumps. Breast pain is seldom associated with cancer (malignancy), but should also be evaluated by a caregiver. BREAST SELF-EXAM The best time to examine your breasts is 5 7 days after your menstrual period is over.  ExitCare Patient Information 2013 [place].   Exercise to Stay Healthy Exercise helps you become and stay healthy. EXERCISE IDEAS AND TIPS Choose exercises that:  You enjoy.  Fit into your day. You do not need to exercise really hard to be healthy. You can do exercises at a slow or medium level and stay healthy. You can:  Stretch before and after working out.  Try yoga, Pilates, or tai chi.  Lift weights.  Walk fast, swim, jog, run, climb stairs, bicycle, dance, or rollerskate.  Take aerobic classes. Exercises that burn about 150 calories:  Running 1  miles in 15 minutes.  Playing volleyball for 45 to 60 minutes.  Washing and waxing a car for 45 to 60 minutes.  Playing touch football for 45 minutes.  Walking 1  miles in 35 minutes.  Pushing a stroller 1  miles in 30 minutes.  Playing basketball for 30 minutes.  Raking leaves for 30 minutes.  Bicycling 5 miles in 30 minutes.  Walking 2 miles in 30 minutes.  Dancing for 30 minutes.  Shoveling snow for 15 minutes.  Swimming laps for 20 minutes.  Walking up stairs for 15 minutes.  Bicycling 4 miles in 15 minutes.  Gardening for 30 to 45 minutes.  Jumping rope for 15 minutes.  Washing windows or floors for 45 to 60 minutes. Document Released: 05/22/2010 Document Revised: 07/12/2011 Document Reviewed: 05/22/2010 [facility] Patient Information 2013 [place].   Other  topics ( that may be useful information):    Sexually Transmitted Disease Sexually transmitted disease (STD) refers to any infection that is passed from person to person during sexual activity. This may happen by way of saliva, semen, blood, vaginal mucus, or urine. Common STDs include:  Gonorrhea.  Chlamydia.  Syphilis.  HIV/AIDS.  Genital herpes.  Hepatitis B and C.  Trichomonas.  Human papillomavirus (HPV).  Pubic lice. CAUSES  An STD may be spread by bacteria, virus, or parasite. A person can get an STD by:  Sexual intercourse with an infected person.  Sharing sex toys with an infected person.  Sharing needles with an infected person.  Having intimate contact with the genitals, mouth, or rectal areas of an infected person. SYMPTOMS  Some people may not have any symptoms, but they can still pass the infection to others. Different STDs have different symptoms. Symptoms include:  Painful or bloody urination.  Pain in the pelvis, abdomen, vagina, anus, throat, or eyes.  Skin rash, itching, irritation, growths, or sores (lesions). These usually occur in the genital or anal area.  Abnormal vaginal discharge.  Penile discharge in men.  Soft, flesh-colored skin growths in the genital or anal area.  Fever.  Pain or bleeding during sexual intercourse.  Swollen glands in the groin area.  Yellow skin and eyes (jaundice). This is seen with hepatitis. DIAGNOSIS  To make a diagnosis, your caregiver may:  Take a medical history.  Perform a physical exam.  Take a specimen (culture) to be examined.  Examine a sample of discharge under a microscope.  Perform blood test TREATMENT   Chlamydia, gonorrhea, trichomonas, and syphilis can be cured with antibiotic medicine.  Genital herpes, hepatitis, and HIV can be treated, but not cured, with prescribed medicines. The medicines will lessen the symptoms.  Genital warts from HPV can be treated with medicine or by  freezing, burning (electrocautery), or surgery. Warts may come back.  HPV is a virus and cannot be cured with medicine or surgery.However, abnormal areas may be followed very closely by your caregiver and may be removed from the cervix, vagina, or vulva through office procedures or surgery. If your diagnosis is confirmed, your recent sexual partners need treatment. This is true even if they are symptom-free or have a negative culture or evaluation. They should not have sex until their caregiver says it is okay. HOME CARE INSTRUCTIONS  All sexual partners should be informed, tested, and treated for all STDs.  Take your antibiotics as directed. Finish them even if you start to feel better.  Only take over-the-counter or prescription medicines for pain, discomfort, or fever as directed by your caregiver.  Rest.  Eat a balanced diet and drink enough fluids to keep your urine clear or pale yellow.  Do not have sex until treatment is completed and you have followed up with your caregiver. STDs should be checked after treatment.  Keep all follow-up appointments, Pap tests, and blood tests as directed by your caregiver.  Only use latex condoms and water-soluble lubricants during sexual activity. Do not use petroleum jelly or oils.  Avoid alcohol and illegal drugs.  Get vaccinated for HPV and hepatitis. If you have not received these vaccines in the past, talk to your caregiver about whether one or both might be right for you.  Avoid risky sex practices that can break the skin. The only way to avoid getting an STD is to avoid all sexual activity.Latex condoms and dental dams (for oral sex) will help lessen the risk of getting an STD, but will not completely eliminate the risk. SEEK MEDICAL CARE IF:   You have a fever.  You have any new or worsening symptoms. Document Released: 07/10/2002 Document Revised: 07/12/2011 Document Reviewed: 07/17/2010 [facility] Patient Information 2013  [place].    Domestic Abuse You are being battered or abused if someone close to you hits, pushes, or physically hurts you in any way. You also are being abused if you are forced into activities. You are being sexually abused if you are forced to have sexual contact of any kind. You are being emotionally abused if you are made to feel worthless or if you are constantly threatened. It is important to remember that help is available. No one has the right to abuse you. PREVENTION OF FURTHER ABUSE  Learn the warning signs of danger. This varies with situations but may include: the use of alcohol, threats, isolation from friends and family, or forced sexual contact. Leave if you feel that violence is going to occur.  If you are attacked or beaten, report it to the police so the abuse is documented. You do not have to press charges. The police can protect you while you or the attackers are leaving. Get the officer's name  and badge number and a copy of the report.  Find someone you can trust and tell them what is happening to you: your caregiver, a nurse, clergy member, close friend or family member. Feeling ashamed is natural, but remember that you have done nothing wrong. No one deserves abuse. Document Released: 04/16/2000 Document Revised: 07/12/2011 Document Reviewed: 06/25/2010 [facility] Patient Information 2013 [place].    How Much is Too Much Alcohol? Drinking too much alcohol can cause injury, accidents, and health problems. These types of problems can include:   Car crashes.  Falls.  Family fighting (domestic violence).  Drowning.  Fights.  Injuries.  Burns.  Damage to certain organs.  Having a baby with birth defects. ONE DRINK CAN BE TOO MUCH WHEN YOU ARE:  Working.  Pregnant or breastfeeding.  Taking medicines. Ask your doctor.  Driving or planning to drive. If you or someone you know has a drinking problem, get help from a doctor.  Document Released:  02/13/2009 Document Revised: 07/12/2011 Document Reviewed: 02/13/2009 [facility] Patient Information 2013 [place].   Smoking Hazards Smoking cigarettes is extremely bad for your health. Tobacco smoke has over 200 known poisons in it. There are over 60 chemicals in tobacco smoke that cause cancer. Some of the chemicals found in cigarette smoke include:   Cyanide.  Benzene.  Formaldehyde.  Methanol (wood alcohol).  Acetylene (fuel used in welding torches).  Ammonia. Cigarette smoke also contains the poisonous gases nitrogen oxide and carbon monoxide.  Cigarette smokers have an increased risk of many serious medical problems and Smoking causes approximately:  90% of all lung cancer deaths in men.  80% of all lung cancer deaths in women.  90% of deaths from chronic obstructive lung disease. Compared with nonsmokers, smoking increases the risk of:  Coronary heart disease by 2 to 4 times.  Stroke by 2 to 4 times.  Men developing lung cancer by 23 times.  Women developing lung cancer by 13 times.  Dying from chronic obstructive lung diseases by 12 times.  . Smoking is the most preventable cause of death and disease in our society.  WHY IS SMOKING ADDICTIVE?  Nicotine is the chemical agent in tobacco that is capable of causing addiction or dependence.  When you smoke and inhale, nicotine is absorbed rapidly into the bloodstream through your lungs. Nicotine absorbed through the lungs is capable of creating a powerful addiction. Both inhaled and non-inhaled nicotine may be addictive.  Addiction studies of cigarettes and spit tobacco show that addiction to nicotine occurs mainly during the teen years, when young people begin using tobacco products. WHAT ARE THE BENEFITS OF QUITTING?  There are many health benefits to quitting smoking.   Likelihood of developing cancer and heart disease decreases. Health improvements are seen almost immediately.  Blood pressure, pulse  rate, and breathing patterns start returning to normal soon after quitting. QUITTING SMOKING   American Lung Association - 1-800-LUNGUSA  American Cancer Society - 1-800-ACS-2345 Document Released: 05/27/2004 Document Revised: 07/12/2011 Document Reviewed: 01/29/2009 [facility] Patient Information 2013 [place].   Stress Management Stress is a state of physical or mental tension that often results from changes in your life or normal routine. Some common causes of stress are:  Death of a loved one.  Injuries or severe illnesses.  Getting fired or changing jobs.  Moving into a new home. Other causes may be:  Sexual problems.  Business or financial losses.  Taking on a large debt.  Regular conflict with someone at home  or at work.  Constant tiredness from lack of sleep. It is not just bad things that are stressful. It may be stressful to:  Win the lottery.  Get married.  Buy a new car. The amount of stress that can be easily tolerated varies from person to person. Changes generally cause stress, regardless of the types of change. Too much stress can affect your health. It may lead to physical or emotional problems. Too little stress (boredom) may also become stressful. SUGGESTIONS TO REDUCE STRESS:  Talk things over with your family and friends. It often is helpful to share your concerns and worries. If you feel your problem is serious, you may want to get help from a professional counselor.  Consider your problems one at a time instead of lumping them all together. Trying to take care of everything at once may seem impossible. List all the things you need to do and then start with the most important one. Set a goal to accomplish 2 or 3 things each day. If you expect to do too many in a single day you will naturally fail, causing you to feel even more stressed.  Do not use alcohol or drugs to relieve stress. Although you may feel better for a short time, they do not  remove the problems that caused the stress. They can also be habit forming.  Exercise regularly - at least 3 times per week. Physical exercise can help to relieve that "uptight" feeling and will relax you.  The shortest distance between despair and hope is often a good night's sleep.  Go to bed and get up on time allowing yourself time for appointments without being rushed.  Take a short "time-out" period from any stressful situation that occurs during the day. Close your eyes and take some deep breaths. Starting with the muscles in your face, tense them, hold it for a few seconds, then relax. Repeat this with the muscles in your neck, shoulders, hand, stomach, back and legs.  Take good care of yourself. Eat a balanced diet and get plenty of rest.  Schedule time for having fun. Take a break from your daily routine to relax. HOME CARE INSTRUCTIONS   Call if you feel overwhelmed by your problems and feel you can no longer manage them on your own.  Return immediately if you feel like hurting yourself or someone else. Document Released: 10/13/2000 Document Revised: 07/12/2011 Document Reviewed: 06/05/2007 [facility] Patient Information 2013 [place].

## 2014-07-23 NOTE — Progress Notes (Signed)
24 y.o. G0P0 Single  African American Fe here for annual exam. Periods normal, with occasional missed period. Last period was 06/12/14. Recent spotting, but no full period. Contraception condoms. No STD screening. Recently done on visit here. Plans to start on Depo Provera with next period. Currently treating with Metrogel for BV diagnosed on 06-26-13. Symptoms had continued, her mother had medication and she recently obtained it. Using as directed. Sees Urgent care if needed. No other health issues today.  Patient's last menstrual period was 06/12/2014.          Sexually active: Yes.    The current method of family planning is condoms most of the time.    Exercising: No.  exercise Smoker:  no  Health Maintenance: Pap:  07-16-13 LGSIL no evaluation due to age MMG:  none Colonoscopy:  none BMD:   none TDaP:  2010 Labs: Hgb-13.4, urine: negative Self breast exam: done monthly POCT UPT negative   reports that she has never smoked. She does not have any smokeless tobacco history on file. She reports that she drinks about 0.6 - 1.2 oz of alcohol per week. She reports that she does not use illicit drugs.  Past Medical History  Diagnosis Date  . Migraines   . STD (sexually transmitted disease)     chlamydia treated 2011, HSV 2    Past Surgical History  Procedure Laterality Date  . Colposcopy  5/09    [facility]    Current Outpatient Prescriptions  Medication Sig Dispense Refill  . metroNIDAZOLE (METROGEL) 0.75 % vaginal gel Place 1 Applicatorful vaginally at bedtime. 70 g 0   No current facility-administered medications for this visit.    Family History  Problem Relation Age of Onset  . Hypertension Maternal Grandmother   . Hypertension Paternal Grandmother     ROS:  Pertinent items are noted in HPI.  Otherwise, a comprehensive ROS was negative.  Exam:   BP 100/60 mmHg  Pulse 70  Resp 16  Ht 5' 2.75" (1.594 m)  Wt 114 lb (51.71 kg)  BMI 20.35 kg/m2  LMP 06/12/2014 Height: 5'  2.75" (159.4 cm) Ht Readings from Last 3 Encounters:  07/23/14 5' 2.75" (1.594 m)  06/26/14 5' 2.5" (1.588 m)  12/13/13 5' 2.5" (1.588 m)    General appearance: alert, cooperative and appears stated age Head: Normocephalic, without obvious abnormality, atraumatic Neck: no adenopathy, supple, symmetrical, trachea midline and thyroid normal to inspection and palpation Lungs: clear to auscultation bilaterally Breasts: normal appearance, no masses or tenderness, No nipple retraction or dimpling, No nipple discharge or bleeding, No axillary or supraclavicular adenopathy Heart: regular rate and rhythm Abdomen: soft, non-tender; no masses,  no organomegaly Extremities: extremities normal, atraumatic, no cyanosis or edema Skin: Skin color, texture, turgor normal. No rashes or lesions Lymph nodes: Cervical, supraclavicular, and axillary nodes normal. No abnormal inguinal nodes palpated Neurologic: Grossly normal   Pelvic: External genitalia:  no lesions              Urethra:  normal appearing urethra with no masses, tenderness or lesions              Bartholin's and Skene's: normal                 Vagina: normal appearing vagina with normal color and discharge, no lesions              Cervix: normal, non tender no lesions  Pap taken: Yes.   Bimanual Exam:  Uterus:  normal size, contour, position, consistency, mobility, non-tender              Adnexa: normal adnexa and no mass, fullness, tenderness               Rectovaginal: Confirms               Anus:  normal appearance  Chaperone present: Yes  A:  Well Woman with normal exam  Contraception condoms,desires Depo Provera  STD screening  BV under treatment now  Irregular cycle with negative UPT  History of LSIL pap follow up pap today  P:   Reviewed health and wellness pertinent to exam  Rx Depo Provera 150 mg IM x 4 refills, patient to call on next menses, needs to come day 1-5 for start of Depo and consistent condom  use.  Lab HIV, RPR  Continue Metrogel as instructed.  Discussed keeping record of menses and if no menses in next 2 months needs to call. Patient agreeable.  Pap smear taken today with HPVHR if negative repeat one year if not per results  counseled on breast self exam, STD prevention, HIV risk factors and prevention, adequate intake of calcium and vitamin D, diet and exercise  return annually or prn  An After Visit Summary was printed and given to the patient.

## 2014-07-24 ENCOUNTER — Telehealth: Payer: Self-pay

## 2014-07-24 LAB — RPR

## 2014-07-24 NOTE — Telephone Encounter (Signed)
No mailbox set up. Try again. 

## 2014-07-24 NOTE — Telephone Encounter (Signed)
Spoke with patient. Results given. Patient is agreeable.  Routing to provider for final review. Patient agreeable to disposition. Will close encounter   

## 2014-07-24 NOTE — Telephone Encounter (Signed)
-----   Message from Verner Choleborah S Leonard, CNM sent at 07/24/2014  7:48 AM EDT ----- Notify patient that HIV, RPR ae negative

## 2014-07-26 LAB — IPS PAP TEST WITH HPV

## 2014-07-30 ENCOUNTER — Telehealth: Payer: Self-pay

## 2014-07-30 NOTE — Telephone Encounter (Signed)
-----   Message from Verner Choleborah S Leonard, CNM sent at 07/28/2014  6:03 PM EDT ----- Notify patient that pap smear unsatisfactory due to cellular material and mucous, to few cells to interpret. Please schedule repeat

## 2014-07-30 NOTE — Telephone Encounter (Signed)
Pt to call back to schedule appt.

## 2014-07-31 NOTE — Progress Notes (Signed)
Reviewed personally.  M. Suzanne Delmus Warwick, MD.  

## 2014-08-13 ENCOUNTER — Ambulatory Visit (INDEPENDENT_AMBULATORY_CARE_PROVIDER_SITE_OTHER): Payer: BLUE CROSS/BLUE SHIELD | Admitting: Certified Nurse Midwife

## 2014-08-13 VITALS — BP 100/68 | HR 80 | Resp 16 | Ht 62.75 in | Wt 118.0 lb

## 2014-08-13 DIAGNOSIS — Z124 Encounter for screening for malignant neoplasm of cervix: Secondary | ICD-10-CM

## 2014-08-13 DIAGNOSIS — R87615 Unsatisfactory cytologic smear of cervix: Secondary | ICD-10-CM

## 2014-08-13 NOTE — Telephone Encounter (Signed)
appt is scheduled for today

## 2014-08-13 NOTE — Progress Notes (Signed)
.  24 y.o. G0P0 SingleAfrican American  F here for repeat Pap smear due to history of unsatisfactory due to excessive mucous and too few cells for intrepretation. on pap done 07/30/14.  Patient had BV at aex. Today she denies vaginal symptoms or STD concerns.  Patient's last menstrual period was 08/07/2014.Marland Kitchen.  Contraception:  EXAM: BP 100/68 mmHg  Pulse 80  Resp 16  Ht 5' 2.75" (1.594 m)  Wt 118 lb (53.524 kg)  BMI 21.07 kg/m2  LMP 08/07/2014 General appearance:  WNWD Female, NAD Pelvic exam: VULVA: normal appearing vulva with no masses, tenderness or lesions, VAGINA: normal appearing vagina with normal color and discharge, no lesions, CERVIX: normal appearing cervix without discharge or lesions, PAP: Pap smear done today. Pap smear obtained.  Assessment: History of unsatisfactory pap smear due excessive mucous( patient had BV) and too few cells  Plan: Repeat Pap depends on results, if negative in 3 years.   Rv prn

## 2014-08-15 LAB — IPS PAP TEST WITH REFLEX TO HPV

## 2014-08-18 ENCOUNTER — Encounter: Payer: Self-pay | Admitting: Certified Nurse Midwife

## 2014-08-18 NOTE — Progress Notes (Signed)
Reviewed personally.  M. Suzanne Tayia Stonesifer, MD.  

## 2014-10-30 ENCOUNTER — Telehealth: Payer: Self-pay | Admitting: Certified Nurse Midwife

## 2014-10-30 NOTE — Telephone Encounter (Signed)
Patient is asking to come in for "some testing'. Patient also wants to talk about her birth control. Last seen 08/13/14.

## 2014-10-30 NOTE — Telephone Encounter (Signed)
Spoke with patient. Patient states that she would like to come in for all STD testing. Would also like to discuss her cycles and trying to start on Depo. Appointment scheduled for tomorrow 10/31/2014 at 11:15am with Verner Choleborah S. Leonard CNM. Patient is agreeable and verbalizes understanding.  Routing to provider for final review. Patient agreeable to disposition. Will close encounter.

## 2014-10-31 ENCOUNTER — Telehealth: Payer: Self-pay | Admitting: Certified Nurse Midwife

## 2014-10-31 ENCOUNTER — Ambulatory Visit: Payer: BLUE CROSS/BLUE SHIELD | Admitting: Certified Nurse Midwife

## 2014-10-31 NOTE — Telephone Encounter (Signed)
FYI only--patient cancelled her appointment with Leota Sauerseborah Leonard, CNM today. She rescheduled to tomorrow. Separate staff message to provider as well.

## 2014-11-01 ENCOUNTER — Encounter: Payer: Self-pay | Admitting: Certified Nurse Midwife

## 2014-11-01 ENCOUNTER — Ambulatory Visit (INDEPENDENT_AMBULATORY_CARE_PROVIDER_SITE_OTHER): Payer: BLUE CROSS/BLUE SHIELD | Admitting: Certified Nurse Midwife

## 2014-11-01 ENCOUNTER — Ambulatory Visit: Payer: BLUE CROSS/BLUE SHIELD | Admitting: Certified Nurse Midwife

## 2014-11-01 VITALS — BP 110/64 | HR 68 | Resp 16 | Ht 62.75 in | Wt 117.0 lb

## 2014-11-01 DIAGNOSIS — Z113 Encounter for screening for infections with a predominantly sexual mode of transmission: Secondary | ICD-10-CM | POA: Diagnosis not present

## 2014-11-01 DIAGNOSIS — A5901 Trichomonal vulvovaginitis: Secondary | ICD-10-CM

## 2014-11-01 HISTORY — DX: Trichomonal vulvovaginitis: A59.01

## 2014-11-01 NOTE — Progress Notes (Signed)
  23 yrs Single [place] American female G0P0 here for questionable STD exposure.Sexual contact with individual with uncertain background 2 weeks ago.  Patient's last menstrual period was 10/16/2014.  Contraception:The current method of family planning is condoms most of the time.She denies abnormal vaginal bleeding, discharge , or pelvic pain, no dysuria, frequency or hematuria. Denies new personal products.  Plans to restart Depo Provera on next period. No other health concerns today.                                         General:Healthy female WDWN Affect: normal, orientation X 3 Skin: warm and dry Abdomen: soft non-tender  Lymph groin:Normal, non tender   Pelvic Exam:EGBUS normal.  Vulva reveals no erythema, lesions or atrophic changes.  Vagina normal, no lesions, polyps or discharge.  Cervix is normal, smooth pink mucosa, no friability, nontender, no CMT, no lesions, no polyps.  Uterus: normal, non tender Adnexae: normal, non tender, no masses Perineal area: no lesions noted Rectal: no lesions noted Specimens collected                    Assessment: Normal Pelvic Exam                         Contraception:Condoms most of time STD Screening     Plan :Reviewed findings of normal pelvic exam. Patient planning Depo Provera again and is aware to call when she is on menses to restart. Lab: GC,Chl,HIV RPR, Hepatitis C,Affirm          STD prevention reviewed.  Questions addressed.         Repeat  STD screen 3 months               RV   :   :      :       Red Flags:  Missed period:  Recent antibiotics:  Possible STD exposure:  IUD:  Diabetes:

## 2014-11-01 NOTE — Telephone Encounter (Signed)
Patient rescheduled by front desk for appointment today 7/1 at 3:15pm with Verner Choleborah S. Leonard CNM.  Routing to provider for final review. Patient agreeable to disposition. Will close encounter.

## 2014-11-01 NOTE — Patient Instructions (Signed)

## 2014-11-01 NOTE — Telephone Encounter (Signed)
Patient called and cancelled her appointment for this morning with Leota Sauerseborah Leonard, CNM due to "a family emergency, had to take a family member to the doctor." The patient hopes to come in this afternoon but there were no open slot I could offer her. She requested a nurse call her to see if she can be worked in today, if possible.  Cc: D. Darcel BayleyLeonard, CNM

## 2014-11-02 LAB — WET PREP BY MOLECULAR PROBE
Candida species: NEGATIVE
GARDNERELLA VAGINALIS: POSITIVE — AB
Trichomonas vaginosis: POSITIVE — AB

## 2014-11-05 ENCOUNTER — Telehealth: Payer: Self-pay | Admitting: Emergency Medicine

## 2014-11-05 ENCOUNTER — Other Ambulatory Visit: Payer: Self-pay | Admitting: Certified Nurse Midwife

## 2014-11-05 DIAGNOSIS — B9689 Other specified bacterial agents as the cause of diseases classified elsewhere: Secondary | ICD-10-CM

## 2014-11-05 DIAGNOSIS — N760 Acute vaginitis: Principal | ICD-10-CM

## 2014-11-05 DIAGNOSIS — A599 Trichomoniasis, unspecified: Secondary | ICD-10-CM

## 2014-11-05 MED ORDER — METRONIDAZOLE 500 MG PO TABS: 500.0000 mg | ORAL_TABLET | Freq: Two times a day (BID) | ORAL | Status: DC

## 2014-11-05 NOTE — Telephone Encounter (Signed)
-----   Message from Verner Choleborah S Leonard, CNM sent at 11/05/2014  8:13 AM EDT ----- Notify patient that affirm is positive for BV and for Trichomonas. Yeast is negative. Trichomonas is an STD so partner will need to treated. Order in for Flagyl 500 mg bid x 7 which will treat both. Gc, Chlamydia pending, needs to come in for blood work(orders in)

## 2014-11-05 NOTE — Telephone Encounter (Signed)
-----   Message from Verner Choleborah S Leonard, CNM sent at 11/05/2014  8:16 AM EDT ----- Will also need TOC one month please schedule. See previous note

## 2014-11-05 NOTE — Progress Notes (Signed)
Reviewed personally.  M. Suzanne Kearah Gayden, MD.  

## 2014-11-05 NOTE — Telephone Encounter (Signed)
[id number] Incoming call from patient.  Patient given messages from Verner Choleborah S. Leonard CNM.   Patient agreeable to plan of care..Instructions given regarding Flagyl. Do not mix with alcohol. If mixed with alcohol can cause severe nausea, vomiting and severe abdominal cramping. Avoid alcohol for 72 hours prior to starting medication, during treatment and 72 hours after completing medication. Normal side effects of flagyl may include a metallic taste in the mouth, slight nausea, headache, abdominal cramping, diarrhea and dizziness.     Patient scheduled for test of cure in one month.  Patient verbalized understanding of instructions and will follow up in one month as scheduled.   Routing to provider for final review. Patient agreeable to disposition. Will close encounter.

## 2014-11-05 NOTE — Telephone Encounter (Signed)
Called patient on mobile (number listed on designated party release form) x 2. Unable to leave message.

## 2014-11-06 ENCOUNTER — Other Ambulatory Visit (INDEPENDENT_AMBULATORY_CARE_PROVIDER_SITE_OTHER): Payer: BLUE CROSS/BLUE SHIELD

## 2014-11-06 DIAGNOSIS — Z113 Encounter for screening for infections with a predominantly sexual mode of transmission: Secondary | ICD-10-CM

## 2014-11-07 ENCOUNTER — Telehealth: Payer: Self-pay

## 2014-11-07 LAB — STD PANEL
HIV 1&2 Ab, 4th Generation: NONREACTIVE
Hepatitis B Surface Ag: NEGATIVE

## 2014-11-07 LAB — HEPATITIS C ANTIBODY: HCV Ab: NEGATIVE

## 2014-11-07 LAB — IPS N GONORRHOEA AND CHLAMYDIA BY PCR

## 2014-11-07 NOTE — Telephone Encounter (Signed)
Patient notified of results. See lab 

## 2014-11-07 NOTE — Telephone Encounter (Signed)
-----   Message from Verner Choleborah S Leonard, CNM sent at 11/07/2014  8:18 AM EDT ----- Notify Hep B,C are negative, HIV and RPR are negative Ignore the needs lab from previous note, these just came in

## 2014-11-07 NOTE — Telephone Encounter (Signed)
No voicemail set up. Try again. 

## 2014-11-08 ENCOUNTER — Telehealth: Payer: Self-pay | Admitting: Certified Nurse Midwife

## 2014-11-08 NOTE — Telephone Encounter (Signed)
Patient is having a reaction to the medication she was prescribed.

## 2014-11-08 NOTE — Telephone Encounter (Signed)
Patient needs to stop medication. She can take Benadryl per OTC instructions for itching. Needs to consider this a reaction to medication. She can also do aveeno bath for itching also. Do not want patient to treat with any thing else until the reaction has stopped. Then will treat with Boric Acid at that time. She needs to be sure if SOB to go to ER. Have her call when rash and itching has subsided.

## 2014-11-08 NOTE — Telephone Encounter (Signed)
Spoke with patient. Patient states that started taking Flagyl 500 mg BID for treatment of BV and Trich on 11/05/2014. Yesterday she began to develop a rash which has spread over her neck, back, and arms. Denies any swelling, trouble breathing, nausea, vomiting, or fever. "it is just very itchy and is all over." Patient has not taken Rx today. Advised not to take medication at this time. Advised will speak with Verner Choleborah S. Leonard CNM regarding symptoms and further recommendations. Patient is agreeable.

## 2014-11-08 NOTE — Telephone Encounter (Signed)
Spoke with patient. Advised of message as seen below from Verner Choleborah S. Leonard CNM. Patient is agreeable and verbalizes understanding. Patient will be seen at ER if becomes SOB. Will return call to office once rash and itching has subsided to switch medication.  Asking for Flagyl to be placed on her allergy list. Added to list in EPIC.  Routing to provider for final review. Patient agreeable to disposition. Will close encounter.

## 2014-11-11 ENCOUNTER — Telehealth: Payer: Self-pay

## 2014-11-11 ENCOUNTER — Encounter: Payer: Self-pay | Admitting: Nurse Practitioner

## 2014-11-11 ENCOUNTER — Ambulatory Visit (INDEPENDENT_AMBULATORY_CARE_PROVIDER_SITE_OTHER): Payer: BLUE CROSS/BLUE SHIELD | Admitting: Nurse Practitioner

## 2014-11-11 VITALS — BP 110/72 | HR 64 | Ht 62.75 in | Wt 117.0 lb

## 2014-11-11 DIAGNOSIS — R21 Rash and other nonspecific skin eruption: Secondary | ICD-10-CM | POA: Diagnosis not present

## 2014-11-11 NOTE — Progress Notes (Signed)
24 y.o. SAA Fe} female G0P0 here for follow up on Trichomonas and BV that was diagnosed on 11/01/14.   She had routine STD testing with exposure 2 weeks prior.  She was treated with Flagyl 500 mg BID for a week initiated on 11/05/14 with return of lab test.  By Thursday pm rash started over torso that spread to the extremities with itching.  By Friday am she was instructed to stop the medication and start on Benadryl.  Denies any symptoms of dyspnea, palpitations, swelling.  Since on Benadryl she feels better and rash is a lot less.  Still itching at times especially when she gets warm.  She works part time at a bar and did not consume ETOH but may have spilled some on her hands for a brief time.    When she came in initially this am wanted a note to be back to work since this rash was not contagious.  After the note was given she then decided not to return to work toady since the rash was still prominent and causing itching.  She was then added to the schedule.    O: Healthy WD,WN female Affect:  normal Skin:  Multiple areas of rash over the arms, neck and chest. Abdomen: no pain No respiratory symptoms or dyspnea, no swelling of mouth  A: STD screening with + BV and Trichomonas  What seems like allergic reaction to Flagyl with rash over torso and extremities    P:  Discussed treatment options - unable to use Tindamax since the same family     Labs none at this time  Instructions given regarding: need to continue Benadryl and will need to consult with MD   See phone note.  RV

## 2014-11-11 NOTE — Telephone Encounter (Signed)
Letter written and signed for patient to return to work. Patient states that she would like letter to excuse her from work today. After review with Lauro FranklinPatricia Rolen-Grubb, FNP. Patient placed on scheduled for today at 10am with Lauro FranklinPatricia Rolen-Grubb, FNP for review of rash before letter is written. Patient is agreeable to plan.

## 2014-11-11 NOTE — Telephone Encounter (Signed)
Patient came in to office today requesting note for work. Spoke with patient on 11/08/2014 regarding possible medication reaction to Flagyl. Patient began to have a rash and was itching. Patient requesting a note stating that she is able to work. States she is still "a little irritated" but that itching and rash is going away. Advised by front desk that I will need to speak with Lauro FranklinPatricia Rolen-Grubb, FNP who is covering provider regarding letter.

## 2014-11-11 NOTE — Telephone Encounter (Signed)
Pt. is called back after consult with Dr. Oscar LaJertson.  Per Up to Date information she needs to be desensitized to make sure she is having a drug reaction to Flagyl.  This is the only drug classification that will treat a Trichomonas infection.  She can be seen by Infection Disease or PCP.  She was advised of above recomendations and will see her PCP who is Urgent Care.

## 2014-11-11 NOTE — Patient Instructions (Addendum)
Continue with Benadryl as needed.  We will call you with a recommendation for medication.

## 2014-11-13 ENCOUNTER — Telehealth: Payer: Self-pay | Admitting: Certified Nurse Midwife

## 2014-11-13 ENCOUNTER — Other Ambulatory Visit: Payer: Self-pay

## 2014-11-13 MED ORDER — NONFORMULARY OR COMPOUNDED ITEM: Status: DC

## 2014-11-13 NOTE — Telephone Encounter (Signed)
error 

## 2014-11-13 NOTE — Telephone Encounter (Signed)
Spoke with patient to follow up from appointment on 7/11/206. Patient states that she went to urgent care as advised but was told "They do not perform testing for allergies. So it did not really help." Patient states that her rash has gone away and she is no longer itching. Advised per Verner Choleborah S. Leonard CNM she may now start Boric Acid tablets 600 mg qhs x 7. Patient is agreeable. TOC appointment scheduled for 12/05/2014 at 8am with Verner Choleborah S. Leonard CNM. Patient is agreeable to date and time. Rx for Boric acid pending Verner Choleborah S. Leonard CNM signature.  Routing to Verner Choleborah S. Leonard CNM for review and signature for orders.

## 2014-11-14 ENCOUNTER — Telehealth: Payer: Self-pay | Admitting: Certified Nurse Midwife

## 2014-11-14 MED ORDER — NONFORMULARY OR COMPOUNDED ITEM: Status: DC

## 2014-11-14 NOTE — Telephone Encounter (Signed)
Signed Rx for Boric Acid 600 mg capsules place one per vagina at bedtime x7 nights [facility].  Routing to provider for final review. Patient agreeable to disposition. Will close encounter.   Patient aware provider will review message and nurse will return call if any additional advice or change of disposition.

## 2014-11-14 NOTE — Telephone Encounter (Signed)
Patient is calling regarding recent prescription. Patient says this as not called to her pharmacy.

## 2014-11-14 NOTE — Progress Notes (Signed)
Encounter reviewed by Dr. Graison Leinberger Amundson C. Silva.  

## 2014-11-14 NOTE — Telephone Encounter (Signed)
Spoke with [facility] who state they are unable to fill rx for boric acid capsules 600 mg. [facility]. Spoke with patient who is agreeable to this. New rx printed for Teresa Velez CNM signature to send to [facility].

## 2014-11-26 ENCOUNTER — Ambulatory Visit (INDEPENDENT_AMBULATORY_CARE_PROVIDER_SITE_OTHER): Payer: BLUE CROSS/BLUE SHIELD | Admitting: Obstetrics and Gynecology

## 2014-11-26 ENCOUNTER — Encounter: Payer: Self-pay | Admitting: Obstetrics and Gynecology

## 2014-11-26 VITALS — BP 112/70 | HR 84 | Resp 15 | Wt 118.0 lb

## 2014-11-26 DIAGNOSIS — N926 Irregular menstruation, unspecified: Secondary | ICD-10-CM | POA: Diagnosis not present

## 2014-11-26 DIAGNOSIS — Z8742 Personal history of other diseases of the female genital tract: Secondary | ICD-10-CM

## 2014-11-26 DIAGNOSIS — Z8619 Personal history of other infectious and parasitic diseases: Secondary | ICD-10-CM

## 2014-11-26 DIAGNOSIS — R1032 Left lower quadrant pain: Secondary | ICD-10-CM | POA: Diagnosis not present

## 2014-11-26 DIAGNOSIS — N915 Oligomenorrhea, unspecified: Secondary | ICD-10-CM | POA: Diagnosis not present

## 2014-11-26 LAB — POCT URINALYSIS DIPSTICK
Bilirubin, UA: NEGATIVE
GLUCOSE UA: NEGATIVE
Ketones, UA: NEGATIVE
LEUKOCYTES UA: NEGATIVE
Nitrite, UA: NEGATIVE
PH UA: 6.5
Protein, UA: NEGATIVE
RBC UA: NEGATIVE
Spec Grav, UA: 1.015
UROBILINOGEN UA: NEGATIVE

## 2014-11-26 LAB — POCT URINE PREGNANCY: Preg Test, Ur: NEGATIVE

## 2014-11-26 NOTE — Progress Notes (Signed)
Patient ID: Stephanne T. Mullarkey, female   DOB: Jul 12, 1990, 24 y.o.   MRN: [id number] GYNECOLOGY  VISIT   HPI: 24 y.o.   Single  African American  female   G0P0 with Patient's last menstrual period was 10/16/2014 (exact date).   here c/o LLQ abdominal pain x 3 days. Irregular menstrual cycles. She also wants to be rechecked for the trichomonas.   The patient was originally seen here and diagnosed with trich and BV earlier this month. She was treated with oral flagyl, but had to stop treatment secondary to development of a rash on the third day of treatment. She was then treated with boric acid for a week. She c/o a mild white, thick vaginal d/c. She hasn't been sexually active since the diagnosis of Trich, rest of her STD testing was negative. The patient c/o a 3 day h/o mild LLQ abdominal and left lower back pain. The pain is intermittent, ranges from 5-9/10 in severity. The pain is crampy baseline with sharp components. Can last for a couple of hours to all day. No fevers, chills, nausea, emesis, diarrhea, constipation or bladder c/o.  She reports irregular menses q 1-2 months (long term) x 2-3 days, light flow. She does c/o dry skin. She doesn't think it was ever worked up.  GYNECOLOGIC HISTORY: Patient's last menstrual period was 10/16/2014 (exact date). Contraception:None Menopausal hormone therapy: N/A Last mammogram: N/A Last pap smear: 08-13-14 WNL         OB History    Gravida Para Term Preterm AB TAB SAB Ectopic Multiple Living   0                  There are no active problems to display for this patient.   Past Medical History  Diagnosis Date  . Migraines   . STD (sexually transmitted disease)     chlamydia treated 2011, HSV 2    Past Surgical History  Procedure Laterality Date  . Colposcopy  5/09    [facility]    No current outpatient prescriptions on file.   No current facility-administered medications for this visit.     ALLERGIES: Amoxicillin; Penicillins; and  Flagyl  Family History  Problem Relation Age of Onset  . Hypertension Maternal Grandmother   . Hypertension Paternal Grandmother     History   Social History  . Marital Status: Single    Spouse Name: N/A  . Number of Children: N/A  . Years of Education: N/A   Occupational History  . Not on file.   Social History Main Topics  . Smoking status: Never Smoker   . Smokeless tobacco: Never Used  . Alcohol Use: 0.6 - 1.2 oz/week    1-2 Standard drinks or equivalent per week  . Drug Use: No  . Sexual Activity:    Partners: Male    Birth Control/ Protection: Condom   Other Topics Concern  . Not on file   Social History Narrative    ROS:  Pertinent items are noted in HPI.  PHYSICAL EXAMINATION:    BP 112/70 mmHg  Pulse 84  Resp 15  Wt 118 lb (53.524 kg)  LMP 10/16/2014 (Exact Date)    General appearance: alert, cooperative and appears stated age Neck: no adenopathy, supple, symmetrical, trachea midline and thyroid normal to inspection and palpation Abdomen: soft, minimally tender in the RLQ, not tender in the LLQ, no rebound, no guarding, no masses,  no organomegaly Lymph nodes: Cervical nodes normal. No abnormal inguinal nodes palpated  Pelvic: External genitalia:  no lesions              Urethra:  normal appearing urethra with no masses, tenderness or lesions              Bartholins and Skenes: normal                 Vagina: normal appearing vagina with a thick, white d/c              Cervix: no lesions, no CMT, no d/c            Bimanual Exam:  Uterus:  normal size, contour, position, consistency, mobility, non-tender              Adnexa: normal adnexa and no mass, fullness, tenderness              Rectovaginal: Yes.  .  Confirms.              Anus:  normal sphincter tone, no lesions  No pelvic floor tenderness  Chaperone was present for exam.  Wet prep: no trich, few WBC, +artifact making interpretation for BV difficult KOH: no yeast PH:  5.5  ASSESSMENT LLQ abdominal pain x 3 days, no associated symptoms, benign exam Oligomenorrhea H/O trich, developed an allergic reaction to flagyl, treated with boric acid    PLAN  UPT negative Wet prep with artifact, difficult to interpret for BV, no trich seen  Send for wet prep probe Send genprobe Check TSH, prolactin Use OTC ibuprofen as needed for pain If her pain persists or worsens in the next 48 hours, she should return for an ultrasound  An After Visit Summary was printed and given to the patient.

## 2014-11-27 LAB — PROLACTIN: Prolactin: 7.5 ng/mL

## 2014-11-27 LAB — GC/CHLAMYDIA PROBE AMP
CT Probe RNA: NEGATIVE
GC PROBE AMP APTIMA: NEGATIVE

## 2014-11-27 LAB — TSH: TSH: 1.591 u[IU]/mL (ref 0.350–4.500)

## 2014-12-05 ENCOUNTER — Ambulatory Visit: Payer: BLUE CROSS/BLUE SHIELD | Admitting: Certified Nurse Midwife

## 2014-12-10 ENCOUNTER — Ambulatory Visit: Payer: BLUE CROSS/BLUE SHIELD | Admitting: Certified Nurse Midwife

## 2014-12-31 ENCOUNTER — Telehealth: Payer: Self-pay | Admitting: Certified Nurse Midwife

## 2014-12-31 NOTE — Telephone Encounter (Signed)
Patient calling stating, "I feel like I have a yeast infection." There are not any slots for appointment this afternoon so routing call to triage.

## 2014-12-31 NOTE — Telephone Encounter (Signed)
Spoke with patient. Patient states that she began to have thick white discharge yesterday. Denies any vaginal itching or irritation. Requesting a medication be called in for a yeast infection. Advised we are unable to treat without the patient being seen in the office for evaluation. Patient declines due to the cost of her copay. Advised if she feels this may be a yeast infection she may use an OTC Monistat day 3 or 7 with hydrocortisone ointment externally and aveeno sitz bath. Advised if symptoms persist will need to be seen for OV. Patient is agreeable and verbalizes understanding.  Routing to provider for final review. Patient agreeable to disposition. Will close encounter.

## 2015-03-10 ENCOUNTER — Encounter: Payer: Self-pay | Admitting: Obstetrics and Gynecology

## 2015-03-10 ENCOUNTER — Telehealth: Payer: Self-pay | Admitting: Obstetrics and Gynecology

## 2015-03-10 ENCOUNTER — Ambulatory Visit (INDEPENDENT_AMBULATORY_CARE_PROVIDER_SITE_OTHER): Payer: BLUE CROSS/BLUE SHIELD | Admitting: Obstetrics and Gynecology

## 2015-03-10 ENCOUNTER — Ambulatory Visit: Payer: BLUE CROSS/BLUE SHIELD | Admitting: Obstetrics and Gynecology

## 2015-03-10 VITALS — BP 120/60 | HR 68 | Resp 14 | Ht 62.75 in | Wt 115.0 lb

## 2015-03-10 DIAGNOSIS — N926 Irregular menstruation, unspecified: Secondary | ICD-10-CM

## 2015-03-10 DIAGNOSIS — N760 Acute vaginitis: Secondary | ICD-10-CM

## 2015-03-10 DIAGNOSIS — R1032 Left lower quadrant pain: Secondary | ICD-10-CM | POA: Diagnosis not present

## 2015-03-10 DIAGNOSIS — N910 Primary amenorrhea: Secondary | ICD-10-CM | POA: Diagnosis not present

## 2015-03-10 DIAGNOSIS — Z113 Encounter for screening for infections with a predominantly sexual mode of transmission: Secondary | ICD-10-CM

## 2015-03-10 DIAGNOSIS — B9689 Other specified bacterial agents as the cause of diseases classified elsewhere: Secondary | ICD-10-CM

## 2015-03-10 DIAGNOSIS — A499 Bacterial infection, unspecified: Secondary | ICD-10-CM | POA: Diagnosis not present

## 2015-03-10 LAB — HEPATITIS C ANTIBODY: HCV AB: NEGATIVE

## 2015-03-10 LAB — POCT URINE PREGNANCY: PREG TEST UR: NEGATIVE

## 2015-03-10 MED ORDER — NAPROXEN SODIUM 550 MG PO TABS: ORAL_TABLET | ORAL | Status: DC

## 2015-03-10 NOTE — Progress Notes (Signed)
GYNECOLOGY  VISIT   HPI: 24 y.o.   Single  African American  female   G0P0 with Patient's last menstrual period was 02/05/2015 (approximate).   here for  STD check. Late period She has been with the same partner x 2 years, thinks he is cheating. Condoms intermittently. She c/o intermittent cramping in her LLQ for 1-2 weeks. The cramping can be up to a 7/10 in severity and lasts for a few minutes at a time. Her cycle is a few days late, under stress with her partner. Last had sex 1 week ago. She is interested in contraception, wants depo-provera. Has been on it before.  She has had trich with this partner before.  No fevers, no abnormal d/c.   GYNECOLOGIC HISTORY: Patient's last menstrual period was 02/05/2015 (approximate). Contraception: Condoms  Menopausal hormone therapy: None        OB History    Gravida Para Term Preterm AB TAB SAB Ectopic Multiple Living   0                  There are no active problems to display for this patient.   Past Medical History  Diagnosis Date  . Migraines   . STD (sexually transmitted disease)     chlamydia treated 2011, HSV 2  . Trichomonal vaginitis 11/01/14    Past Surgical History  Procedure Laterality Date  . Colposcopy  5/09    [facility]    No current outpatient prescriptions on file.   No current facility-administered medications for this visit.     ALLERGIES: Amoxicillin; Penicillins; and Flagyl  Family History  Problem Relation Age of Onset  . Hypertension Maternal Grandmother   . Hypertension Paternal Grandmother     Social History   Social History  . Marital Status: Single    Spouse Name: N/A  . Number of Children: N/A  . Years of Education: N/A   Occupational History  . Not on file.   Social History Main Topics  . Smoking status: Never Smoker   . Smokeless tobacco: Never Used  . Alcohol Use: 0.6 - 1.2 oz/week    1-2 Standard drinks or equivalent per week  . Drug Use: No  . Sexual Activity:    Partners: Male     Birth Control/ Protection: Condom   Other Topics Concern  . Not on file   Social History Narrative    Review of Systems  Gastrointestinal: Positive for abdominal pain.       Bloating  All other systems reviewed and are negative.   PHYSICAL EXAMINATION:    BP 120/60 mmHg  Pulse 68  Resp 14  Ht 5' 2.75" (1.594 m)  Wt 115 lb (52.164 kg)  BMI 20.53 kg/m2  LMP 02/05/2015 (Approximate)    General appearance: alert, cooperative and appears stated age Abdomen: soft, mildly tender in the LLQ, no rebound, no guarding, no masses,  no organomegaly. Mildly distended  Pelvic: External genitalia:  no lesions              Urethra:  normal appearing urethra with no masses, tenderness or lesions              Bartholins and Skenes: normal                 Vagina: normal appearing vagina with normal color, no lesions. Slight increase in white, thick vaginal d/c.              Cervix: no lesions and no  CMT              Bimanual Exam:  Uterus:  normal size, contour, position, consistency, mobility, non-tender              Adnexa: Mildly tender and slight fullness in the left adnexa, right adnexa is normal               Chaperone was present for exam.   Wet prep: +clue, no trich KOH: no yeast PH: 5  ASSESSMENT Screening for STD Contraception, wants to start depo-provera, cycle is a few days late, negative upt LLQ abdominal pain, tender in LLQ and left adnexa, ? Cyst BV     PLAN STD testing Wet prep probe (h/o trich) Use condoms Will abstain from intercourse x 2 weeks, come in, check UPT, if negative, start depo-provera Return for a gyn ultrasound Clindamycin ovules for BV Anaprox for pain   An After Visit Summary was printed and given to the patient.

## 2015-03-10 NOTE — Telephone Encounter (Signed)
Call to patient. She denies symptoms at this time. Scheduled office visit with Dr. Oscar LaJertson today at 1130. Routing to provider for final review. Patient agreeable to disposition. Will close encounter.

## 2015-03-10 NOTE — Telephone Encounter (Signed)
Patient calling wanting to come in to have std testing. Best contact # [phone]

## 2015-03-11 ENCOUNTER — Telehealth: Payer: Self-pay | Admitting: *Deleted

## 2015-03-11 LAB — WET PREP BY MOLECULAR PROBE
Candida species: NEGATIVE
Gardnerella vaginalis: POSITIVE — AB
TRICHOMONAS VAG: NEGATIVE

## 2015-03-11 LAB — STD PANEL
HEP B S AG: NEGATIVE
HIV: NONREACTIVE

## 2015-03-11 LAB — GC/CHLAMYDIA PROBE AMP
CT Probe RNA: NEGATIVE
GC Probe RNA: NEGATIVE

## 2015-03-11 NOTE — Telephone Encounter (Signed)
-----   Message from Romualdo BolkJill Evelyn Jertson, MD sent at 03/11/2015  4:02 PM EST ----- Please advise the patient of normal results.

## 2015-03-11 NOTE — Telephone Encounter (Signed)
I tried to call patient for culture results however her voice mail is not set up. I will try to call her back -eh

## 2015-03-12 NOTE — Telephone Encounter (Signed)
Patient notified.  Verbalized understanding. 

## 2015-03-17 ENCOUNTER — Telehealth: Payer: Self-pay | Admitting: Obstetrics and Gynecology

## 2015-03-17 NOTE — Telephone Encounter (Signed)
Called patient to review benefit for pelvic ultrasound. Called patients listed cell and home numbers. Both numbers had automated recording stating patient was "unavailable to be reached at this time - please try your call again later". No option to leave voicemail.

## 2015-03-18 NOTE — Telephone Encounter (Signed)
Patient returning your call [phone] asked if you cannot reach her by 10 to call her at 11:30.

## 2015-03-18 NOTE — Telephone Encounter (Signed)
Spoke with patient. Reviewed benefit. Patient understands and is agreeable. Patient ready to schedule. Patient would like to schedule for 03/25/15 with Dr Oscar LaJertson, but is hesitant due to work. Patient wishes to discuss with work and call back today to schedule. Once scheduled, patient requests appointment note to fax to her employer as notification of her appointment. Discussed with administrator and verified with patient, once scheduled we will be able to print out note stating she is scheduled for an appointment with our office noting the date and time. Patient agreeable and states she will return call to schedule before end of business today.

## 2015-03-20 NOTE — Telephone Encounter (Signed)
Patient returned call. She is at work. She is scheduled for Pelvic ultrasound  With Dr. Oscar LaJertson for 03/25/15 at 1530 with consult following. Patient will need note for work, but work does not have fax number. Advised can activate mychart and print out appointment confirmations or can receive a note for work at office visit appointment. Discussed cancellation policy. Advised will need to call by tomorrow morning to cancel if needs to do so since scheduling less than 72 business hours away.  Patient agreeable.  cc Lilyan GilfordBecky Frahm Final to Dr. Oscar LaJertson and will close.

## 2015-03-25 ENCOUNTER — Encounter: Payer: Self-pay | Admitting: Obstetrics and Gynecology

## 2015-03-25 ENCOUNTER — Ambulatory Visit (INDEPENDENT_AMBULATORY_CARE_PROVIDER_SITE_OTHER): Payer: BLUE CROSS/BLUE SHIELD

## 2015-03-25 ENCOUNTER — Ambulatory Visit (INDEPENDENT_AMBULATORY_CARE_PROVIDER_SITE_OTHER): Payer: BLUE CROSS/BLUE SHIELD | Admitting: Obstetrics and Gynecology

## 2015-03-25 VITALS — BP 108/60 | HR 88 | Resp 14 | Wt 117.0 lb

## 2015-03-25 DIAGNOSIS — R1032 Left lower quadrant pain: Secondary | ICD-10-CM | POA: Diagnosis not present

## 2015-03-25 DIAGNOSIS — Z3009 Encounter for other general counseling and advice on contraception: Secondary | ICD-10-CM

## 2015-03-25 LAB — POCT URINE PREGNANCY: PREG TEST UR: NEGATIVE

## 2015-03-25 MED ORDER — MEDROXYPROGESTERONE ACETATE 150 MG/ML IM SUSP
150.0000 mg | Freq: Once | INTRAMUSCULAR | Status: AC
  Administered 2015-03-25: 150 mg via INTRAMUSCULAR

## 2015-03-25 NOTE — Patient Instructions (Signed)

## 2015-03-25 NOTE — Progress Notes (Signed)
     S: the patient is here for f/u of LLQ abdominal pain.  Ultrasound today was normal. Her pain has improved. She was treated for BV 2 weeks ago, no vaginitis symptoms. She is wanting to restart depo-provera, on her cycle  Past Medical History  Diagnosis Date  . Migraines   . STD (sexually transmitted disease)     chlamydia treated 2011, HSV 2  . Trichomonal vaginitis 11/01/14   Past Surgical History  Procedure Laterality Date  . Colposcopy  5/09    [facility]   Current Outpatient Prescriptions on File Prior to Visit  Medication Sig Dispense Refill  . naproxen sodium (ANAPROX DS) 550 MG tablet 1 tab po q 12 hours prn pain 30 tablet 2   No current facility-administered medications on file prior to visit.   Allergies  Allergen Reactions  . Amoxicillin Hives  . Penicillins Hives  . Flagyl [Metronidazole] Itching and Rash    Review of Systems  Genitourinary:       Menstrual cycles changes Painful periods   All other systems reviewed and are negative.  O: General: alert, african american female in NAD Blood pressure 108/60, pulse 88, resp. rate 14, weight 117 lb (53.071 kg), last menstrual period 03/24/2015.  A/P 1) LLQ abdominal pain, ultrasound is normal, pain improved  2) Contraception, will restart depo-provera q 12 weeks. Will need an annual exam in 3/17

## 2015-05-22 ENCOUNTER — Telehealth: Payer: Self-pay | Admitting: Certified Nurse Midwife

## 2015-05-22 NOTE — Telephone Encounter (Addendum)
Patient is bleeding on the depo. She would like to come in Friday afternoon if possible. Chart to triage.

## 2015-05-22 NOTE — Telephone Encounter (Signed)
Spoke with patient. Patient restarted her depo on 03/25/2015. States she had bleeding for two weeks then stopped for one week and has now started bleeding again. Advised irregular bleeding is common with starting depo. Patient states "It is not heavy, but it is not normal." Patient is concerned about the bleeding she is experiencing and would like to be seen in the office tomorrow for evaluation. Appointment scheduled for 05/22/2014 at 4 pm with Teresa Velez CNM. Patient is agreeable to date and time.  Routing to provider for final review. Patient agreeable to disposition. Will close encounter.

## 2015-05-23 ENCOUNTER — Encounter: Payer: Self-pay | Admitting: Certified Nurse Midwife

## 2015-05-23 ENCOUNTER — Ambulatory Visit (INDEPENDENT_AMBULATORY_CARE_PROVIDER_SITE_OTHER): Payer: No Typology Code available for payment source | Admitting: Certified Nurse Midwife

## 2015-05-23 VITALS — BP 100/62 | HR 68 | Resp 16 | Ht 62.75 in | Wt 112.0 lb

## 2015-05-23 DIAGNOSIS — N926 Irregular menstruation, unspecified: Secondary | ICD-10-CM | POA: Diagnosis not present

## 2015-05-23 DIAGNOSIS — Z3042 Encounter for surveillance of injectable contraceptive: Secondary | ICD-10-CM

## 2015-05-23 LAB — HEMOGLOBIN, FINGERSTICK: Hemoglobin, fingerstick: 13 g/dL (ref 12.0–16.0)

## 2015-05-23 NOTE — Patient Instructions (Signed)

## 2015-05-23 NOTE — Progress Notes (Signed)
25 y.o. Single African American female G0P0 here for follow up of Depo Provera initiated 03/25/15 for contraception. Patient had no bleeding after injection until 2 weeks ago and moderate bleeding for one week and then lighter for another week ,then spotting and stopped. Started with bleeding 2 days ago light and stopped. Spotting today. Denies any cramping or pelvic pain. No fatigue. Patient had used Depo Provera before, but it has been more than a year. Had amenorrhea with use, so this was different and was concerned something was wrong. No other health concerns today.   O: Healthy WD,WN female Affect: normal, orientation x 3 Skin:warm and dry, color good  Abdomen:soft,  Non tender, no masses Inguinal lymph nodes not enlarged or tender Pelvic exam:EXTERNAL GENITALIA: normal appearing vulva with no masses, tenderness or lesions VAGINA: no abnormal discharge or lesions and very scant pink discharge in vaginal vault CERVIX: no lesions or cervical motion tenderness and no blood noted from cervix UTERUS: normal, non tender, no masses ADNEXA: no masses palpable, nontender and normal adnexa  POCT hgb. 13.0  A: Normal pelvic exam Scant bleeding noted Normal bleeding profile for Depo Provera use   P: Discussed findings of normal pelvic exam and scant blood noted. Discussed normal bleeding profile with Depo Provera use. Discussed excessive bleeding warning signs. Reassured her bleeding is normal. Can do  Motrin  Every 4 hours for 24 hours to see bleeding will stop completely. Patient felt reassured and understands expectations now with use. Due for next Depo Provera 2/7 to 2/21. Patient will schedule.  Rv prn, aex

## 2015-05-26 NOTE — Progress Notes (Signed)
Reviewed personally.  M. Suzanne Caliana Spires, MD.  

## 2015-05-29 ENCOUNTER — Ambulatory Visit (INDEPENDENT_AMBULATORY_CARE_PROVIDER_SITE_OTHER): Payer: No Typology Code available for payment source | Admitting: Certified Nurse Midwife

## 2015-05-29 ENCOUNTER — Ambulatory Visit: Payer: No Typology Code available for payment source | Admitting: Certified Nurse Midwife

## 2015-05-29 ENCOUNTER — Telehealth: Payer: Self-pay | Admitting: Certified Nurse Midwife

## 2015-05-29 ENCOUNTER — Encounter: Payer: Self-pay | Admitting: Certified Nurse Midwife

## 2015-05-29 VITALS — BP 118/70 | HR 68 | Resp 16 | Ht 62.75 in | Wt 113.0 lb

## 2015-05-29 DIAGNOSIS — Z113 Encounter for screening for infections with a predominantly sexual mode of transmission: Secondary | ICD-10-CM | POA: Diagnosis not present

## 2015-05-29 DIAGNOSIS — N926 Irregular menstruation, unspecified: Secondary | ICD-10-CM

## 2015-05-29 DIAGNOSIS — Z3042 Encounter for surveillance of injectable contraceptive: Secondary | ICD-10-CM

## 2015-05-29 LAB — POCT HEMOGLOBIN: HEMOGLOBIN: 12.2 g/dL (ref 12.2–16.2)

## 2015-05-29 LAB — POCT URINE PREGNANCY: PREG TEST UR: NEGATIVE

## 2015-05-29 NOTE — Telephone Encounter (Signed)
Patient called and said, "I am still bleeding since I was seen last Friday and I'd like to come in for a visit. I don't feel right and I am scared."  Paper chart to triage.

## 2015-05-29 NOTE — Telephone Encounter (Signed)
Call to patient. She states she is having dark red vaginal bleeding. She expresses that she feels like bleeding "would have stopped by now." Denies dizziness or weakness. Unable to assess how much she is bleeding today because she states she just woke up but states it is much lighter. Uses Depo Provera for contraception.   Patient states she wishes to come in for STD testing today as well.   Office visit today at 11 with Leota Sauers CNM scheduled. Patient agreeable.   Routing to provider for final review. Patient agreeable to disposition. Will close encounter.

## 2015-05-29 NOTE — Progress Notes (Signed)
25 y.o. single african [place] female G0P0 here for follow up of Depo Provera DUB seen for on 05/23/15. Describes bleeding as light to moderate for 2 days and now light to none. Aware this is normal profile with Depo Provera, but just wanted to make sure all is still normal. Also desires STD screeening. Partner concern. Patient was instructed to try Motrin to see if the bleeding diminsh, she did not because it became less. Denies any pelvic pain or fatigue.  No other health concerns.   O: Healthy WD,WN female Affect: normal Skin:warm and dry, color good Abdomen:soft non tender Pelvic exam:EXTERNAL GENITALIA: normal appearing vulva with no masses, tenderness or lesions VAGINA: no abnormal discharge or lesions and very scant blood noted in vaginal vault CERVIX: no lesions or cervical motion tenderness, normal appearance and non tender, specimens taken UTERUS: normal non tender ADNEXA: no masses palpable and nontender   POCT Hgb. 12.2 05/23/15 13.0 POCT UPT negative  A:Normal pelvic exam Normal Depo Provera bleeding profile with slight decrease in Hgb. STD screening    P: DIscussed findings of normal pelvic exam, with scant blood noted in vagina and from cervix. Discussed Hgb slight decrease, increase water intake and start on OTC multi-vitamin daily Discussed she is due for Depo Provera renewal on 06/10/15 and whether this is a good choice for her for contraception. Patient  Had used OCP prior and forgot pills, will not use IUD, unsure about Nuvaring or Nexplanon. Stressed adjustment period with Depo Provera but this could continue, so to decide if she wants another option. Encourage to do Motrin as discussed to see if bleeding will stop. Plans to do so this time. Warnings of excessive bleeding given again. Questions addressed. Labs: Affirm, HIV,RPR, Hep B, Gc,Chlamydia   RV prn

## 2015-05-29 NOTE — Patient Instructions (Signed)

## 2015-05-30 LAB — STD PANEL
HIV: NONREACTIVE
Hepatitis B Surface Ag: NEGATIVE

## 2015-05-30 LAB — WET PREP BY MOLECULAR PROBE
Candida species: NEGATIVE
GARDNERELLA VAGINALIS: NEGATIVE
TRICHOMONAS VAG: NEGATIVE

## 2015-06-01 NOTE — Progress Notes (Signed)
Reviewed personally.  M. Suzanne Arvid Marengo, MD.  

## 2015-06-03 LAB — IPS N GONORRHOEA AND CHLAMYDIA BY PCR

## 2015-07-21 ENCOUNTER — Ambulatory Visit (INDEPENDENT_AMBULATORY_CARE_PROVIDER_SITE_OTHER): Payer: No Typology Code available for payment source | Admitting: Nurse Practitioner

## 2015-07-21 VITALS — BP 110/64 | HR 100 | Resp 20 | Ht 62.75 in | Wt 113.0 lb

## 2015-07-21 DIAGNOSIS — N912 Amenorrhea, unspecified: Secondary | ICD-10-CM | POA: Diagnosis not present

## 2015-07-21 LAB — POCT URINE PREGNANCY: PREG TEST UR: POSITIVE — AB

## 2015-07-21 NOTE — Patient Instructions (Signed)
First Trimester of Pregnancy The first trimester of pregnancy is from week 1 until the end of week 12 (months 1 through 3). A week after a sperm fertilizes an egg, the egg will implant on the wall of the uterus. This embryo will begin to develop into a baby. Genes from you and your partner are forming the baby. The female genes determine whether the baby is a boy or a girl. At 6-8 weeks, the eyes and face are formed, and the heartbeat can be seen on ultrasound. At the end of 12 weeks, all the baby's organs are formed.  Now that you are pregnant, you will want to do everything you can to have a healthy baby. Two of the most important things are to get good prenatal care and to follow your health care provider's instructions. Prenatal care is all the medical care you receive before the baby's birth. This care will help prevent, find, and treat any problems during the pregnancy and childbirth. BODY CHANGES Your body goes through many changes during pregnancy. The changes vary from woman to woman.   You may gain or lose a couple of pounds at first.  You may feel sick to your stomach (nauseous) and throw up (vomit). If the vomiting is uncontrollable, call your health care provider.  You may tire easily.  You may develop headaches that can be relieved by medicines approved by your health care provider.  You may urinate more often. Painful urination may mean you have a bladder infection.  You may develop heartburn as a result of your pregnancy.  You may develop constipation because certain hormones are causing the muscles that push waste through your intestines to slow down.  You may develop hemorrhoids or swollen, bulging veins (varicose veins).  Your breasts may begin to grow larger and become tender. Your nipples may stick out more, and the tissue that surrounds them (areola) may become darker.  Your gums may bleed and may be sensitive to brushing and flossing.  Dark spots or blotches (chloasma,  mask of pregnancy) may develop on your face. This will likely fade after the baby is born.  Your menstrual periods will stop.  You may have a loss of appetite.  You may develop cravings for certain kinds of food.  You may have changes in your emotions from day to day, such as being excited to be pregnant or being concerned that something may go wrong with the pregnancy and baby.  You may have more vivid and strange dreams.  You may have changes in your hair. These can include thickening of your hair, rapid growth, and changes in texture. Some women also have hair loss during or after pregnancy, or hair that feels dry or thin. Your hair will most likely return to normal after your baby is born. WHAT TO EXPECT AT YOUR PRENATAL VISITS During a routine prenatal visit:  You will be weighed to make sure you and the baby are growing normally.  Your blood pressure will be taken.  Your abdomen will be measured to track your baby's growth.  The fetal heartbeat will be listened to starting around week 10 or 12 of your pregnancy.  Test results from any previous visits will be discussed. Your health care provider may ask you:  How you are feeling.  If you are feeling the baby move.  If you have had any abnormal symptoms, such as leaking fluid, bleeding, severe headaches, or abdominal cramping.  If you are using any tobacco products,   including cigarettes, chewing tobacco, and electronic cigarettes.  If you have any questions. Other tests that may be performed during your first trimester include:  Blood tests to find your blood type and to check for the presence of any previous infections. They will also be used to check for low iron levels (anemia) and Rh antibodies. Later in the pregnancy, blood tests for diabetes will be done along with other tests if problems develop.  Urine tests to check for infections, diabetes, or protein in the urine.  An ultrasound to confirm the proper growth  and development of the baby.  An amniocentesis to check for possible genetic problems.  Fetal screens for spina bifida and Down syndrome.  You may need other tests to make sure you and the baby are doing well.  HIV (human immunodeficiency virus) testing. Routine prenatal testing includes screening for HIV, unless you choose not to have this test. HOME CARE INSTRUCTIONS  Medicines  Follow your health care provider's instructions regarding medicine use. Specific medicines may be either safe or unsafe to take during pregnancy.  Take your prenatal vitamins as directed.  If you develop constipation, try taking a stool softener if your health care provider approves. Diet  Eat regular, well-balanced meals. Choose a variety of foods, such as meat or vegetable-based protein, fish, milk and low-fat dairy products, vegetables, fruits, and whole grain breads and cereals. Your health care provider will help you determine the amount of weight gain that is right for you.  Avoid raw meat and uncooked cheese. These carry germs that can cause birth defects in the baby.  Eating four or five small meals rather than three large meals a day may help relieve nausea and vomiting. If you start to feel nauseous, eating a few soda crackers can be helpful. Drinking liquids between meals instead of during meals also seems to help nausea and vomiting.  If you develop constipation, eat more high-fiber foods, such as fresh vegetables or fruit and whole grains. Drink enough fluids to keep your urine clear or pale yellow. Activity and Exercise  Exercise only as directed by your health care provider. Exercising will help you:  Control your weight.  Stay in shape.  Be prepared for labor and delivery.  Experiencing pain or cramping in the lower abdomen or low back is a good sign that you should stop exercising. Check with your health care provider before continuing normal exercises.  Try to avoid standing for long  periods of time. Move your legs often if you must stand in one place for a long time.  Avoid heavy lifting.  Wear low-heeled shoes, and practice good posture.  You may continue to have sex unless your health care provider directs you otherwise. Relief of Pain or Discomfort  Wear a good support bra for breast tenderness.   Take warm sitz baths to soothe any pain or discomfort caused by hemorrhoids. Use hemorrhoid cream if your health care provider approves.   Rest with your legs elevated if you have leg cramps or low back pain.  If you develop varicose veins in your legs, wear support hose. Elevate your feet for 15 minutes, 3-4 times a day. Limit salt in your diet. Prenatal Care  Schedule your prenatal visits by the twelfth week of pregnancy. They are usually scheduled monthly at first, then more often in the last 2 months before delivery.  Write down your questions. Take them to your prenatal visits.  Keep all your prenatal visits as directed by your   health care provider. Safety  Wear your seat belt at all times when driving.  Make a list of emergency phone numbers, including numbers for family, friends, the hospital, and police and fire departments. General Tips  Ask your health care provider for a referral to a local prenatal education class. Begin classes no later than at the beginning of month 6 of your pregnancy.  Ask for help if you have counseling or nutritional needs during pregnancy. Your health care provider can offer advice or refer you to specialists for help with various needs.  Do not use hot tubs, steam rooms, or saunas.  Do not douche or use tampons or scented sanitary pads.  Do not cross your legs for long periods of time.  Avoid cat litter boxes and soil used by cats. These carry germs that can cause birth defects in the baby and possibly loss of the fetus by miscarriage or stillbirth.  Avoid all smoking, herbs, alcohol, and medicines not prescribed by  your health care provider. Chemicals in these affect the formation and growth of the baby.  Do not use any tobacco products, including cigarettes, chewing tobacco, and electronic cigarettes. If you need help quitting, ask your health care provider. You may receive counseling support and other resources to help you quit.  Schedule a dentist appointment. At home, brush your teeth with a soft toothbrush and be gentle when you floss. SEEK MEDICAL CARE IF:   You have dizziness.  You have mild pelvic cramps, pelvic pressure, or nagging pain in the abdominal area.  You have persistent nausea, vomiting, or diarrhea.  You have a bad smelling vaginal discharge.  You have pain with urination.  You notice increased swelling in your face, hands, legs, or ankles. SEEK IMMEDIATE MEDICAL CARE IF:   You have a fever.  You are leaking fluid from your vagina.  You have spotting or bleeding from your vagina.  You have severe abdominal cramping or pain.  You have rapid weight gain or loss.  You vomit blood or material that looks like coffee grounds.  You are exposed to German measles and have never had them.  You are exposed to fifth disease or chickenpox.  You develop a severe headache.  You have shortness of breath.  You have any kind of trauma, such as from a fall or a car accident.   This information is not intended to replace advice given to you by your health care provider. Make sure you discuss any questions you have with your health care provider.   Document Released: 04/13/2001 Document Revised: 05/10/2014 Document Reviewed: 02/27/2013 Elsevier Interactive Patient Education 2016 [employer]  

## 2015-07-21 NOTE — Progress Notes (Signed)
25 y.o. Single African American female G0P0 here for follow up of possible pregnancy.  She has been having spotting for several months on and off.  She was seen 05/29/15 and spotting was thought due to Depo Provera.  Her last Depo Provera was 03/25/15 and was due to repeat on 06/10/15.  She decided against Depo.  They have been using condoms for birth control but not consistently.  She never really had a normal menses since coming off Depo Provera.  States her bleeding is spotting only without any change is character, color or amount.  She denies symptoms of pain but did have mild cramps at some point earlier this month.  Her LMP is listed as 05/20/15 but she is unsure -  as it has only been spotting.  Maybe a little breast tenderness.  She has several concerns:  She started a new job today and does not have insurance.  She was on her mothers insurance but this does not cover any pregnancy issues. She therefor declines any exam today as this will not be covered.   O: Healthy WD,WN female Affect: normal Abdomen: soft non tender, no flank pain, no suprapubic tenderness Patient declines any further exam.     A: Positive UPT  History of irregular bleeding since January  Prior use of Depo Provera and did not get Depo 06/10/15 as scheduled  Condom use is intermittent  P:  Discussed findings of + UPT.  A long discussion about the need to do exam and follow with serum HCG testing to confirm viable pregnancy vs. Miscarriage.  She understands the rationale but unable to commit until she gets insurance.  We discussed the possibility of tubal pregnancy that can also cause irregular bleeding.  She is given warning signs and symptoms and [facility] if any changes in pain or cramps. She is given information about signing up for Medicaid ASAP and getting coverage for her pregnancy.  She will then seek care at OB/GYN ASAP - list of OB is given to her.  She states she will call today or first thing in  am.  Instructions given regarding: She is again given our concerns and recommendation and declines again any further exam or labs today.  The boyfriend was on his way to also discuss and we waited a while - he was tied up in traffic and then she decided to leave and explain everything to him.   Labs :  None other than UPT as pt declined    RV

## 2015-07-22 ENCOUNTER — Encounter: Payer: Self-pay | Admitting: Nurse Practitioner

## 2015-07-22 NOTE — Progress Notes (Signed)
Encounter reviewed by Dr. Brook Amundson C. Silva.  

## 2015-07-30 ENCOUNTER — Ambulatory Visit: Payer: BLUE CROSS/BLUE SHIELD | Admitting: Certified Nurse Midwife

## 2015-08-04 ENCOUNTER — Ambulatory Visit: Payer: No Typology Code available for payment source | Admitting: Obstetrics and Gynecology

## 2015-08-12 ENCOUNTER — Telehealth: Payer: Self-pay | Admitting: Certified Nurse Midwife

## 2015-08-12 NOTE — Telephone Encounter (Signed)
Spoke with patient. Patient is pregnant. LMP is listed as 05/20/2015, but she is unsure as she had come off of Depo Provera and was having intermittent spotting. Reports she has developed nausea that is interfering with her day to day activities. Reports occasional vomiting with nausea. Last vomited on 08/11/2015. States she is trying to stay hydrated with water and juice. She has not established care with an OB provider yet as she is waiting to get medicaid coverage. Advised I will speak with Ria CommentPatricia Grubb, FNP regarding symptoms and return call with further recommendations. She is agreeable.

## 2015-08-12 NOTE — Telephone Encounter (Signed)
Patient is asking for a new prescription for nausea. Confirmed pharmacy with patient.

## 2015-08-13 NOTE — Telephone Encounter (Signed)
I have discussed this with Dr. Edward JollySilva and Zofran is no longer Ok to give during pregnancy.  Dr. Edward JollySilva says she can call OB and establish care and they can make her Medicaid coverage retroactive.  She is missing some important screening and follow up by not going ahead to OB.  Have her call and make apt now.

## 2015-08-13 NOTE — Telephone Encounter (Signed)
Spoke with patient. Advised of message as seen below from Ria CommentPatricia Grubb, FNP. She is agreeable and verbalizes understanding. She will call to schedule an OB appointment at this time. States she has a list of OB providers that was given to her by Ria CommentPatricia Grubb, FNP at her last visit.  Routing to provider for final review. Patient agreeable to disposition. Will close encounter.

## 2015-08-13 NOTE — Telephone Encounter (Signed)
Patient is asking if Teresa Velez has received an answer from her provider.

## 2015-08-19 ENCOUNTER — Encounter (HOSPITAL_COMMUNITY): Payer: Self-pay | Admitting: *Deleted

## 2015-08-19 ENCOUNTER — Ambulatory Visit (HOSPITAL_COMMUNITY): Payer: Self-pay

## 2015-08-19 ENCOUNTER — Ambulatory Visit (HOSPITAL_COMMUNITY)
Admission: EM | Admit: 2015-08-19 | Discharge: 2015-08-19 | Disposition: A | Discharge status: Home / Self Care | Payer: Self-pay | Attending: Family Medicine | Admitting: Family Medicine

## 2015-08-19 DIAGNOSIS — M25559 Pain in unspecified hip: Secondary | ICD-10-CM

## 2015-08-19 NOTE — ED Notes (Signed)
Patient not in treatment room when work note brought to room.

## 2015-08-19 NOTE — ED Notes (Addendum)
Patient reports bilateral hip pain for unknown time, recently has worsened, pain is intermittent and patient report it seems like her hips are popping. Patient reports relief with supine position. Denies numbness or tingling to lower extremities. Ambulatory without difficulty. No hx of injury. Patient is approx [redacted] weeks pregnant, unsure if pain has increased since pregnancy.

## 2015-08-19 NOTE — ED Provider Notes (Signed)
CSN: [id number]     Arrival date & time 08/19/15  1303 History   First MD Initiated Contact with Patient 08/19/15 1411     Chief Complaint  Patient presents with  . Hip Pain   (Consider location/radiation/quality/duration/timing/severity/associated sxs/prior Treatment) HPI Comments: 24 year oldfemale complaining of bilateral hip pain intermittently for several months. In the past week has become worse. She is complaining of pain primarily to the lateral hips. She denies any form of trauma,repetitive movement, increased activity such as running, jumping.The discomfort is progressive. She states there is a popping sound with abduction.   Past Medical History  Diagnosis Date  . Migraines   . STD (sexually transmitted disease)     chlamydia treated 2011, HSV 2  . Trichomonal vaginitis 11/01/14   Past Surgical History  Procedure Laterality Date  . Colposcopy  5/09    [facility]   Family History  Problem Relation Age of Onset  . Hypertension Maternal Grandmother   . Hypertension Paternal Grandmother    Social History  Substance Use Topics  . Smoking status: Never Smoker   . Smokeless tobacco: Never Used  . Alcohol Use: 0.6 - 1.2 oz/week    1-2 Standard drinks or equivalent per week   OB History    Gravida Para Term Preterm AB TAB SAB Ectopic Multiple Living   1              Review of Systems  Constitutional: Positive for activity change. Negative for fever and fatigue.  HENT: Negative.   Respiratory: Negative.   Genitourinary: Negative.   Musculoskeletal: Positive for myalgias. Negative for neck pain and neck stiffness.       As per history of present illness  Skin: Negative.   Neurological: Negative.  Negative for tremors, weakness and numbness.    Allergies  Amoxicillin; Penicillins; and Flagyl  Home Medications   Prior to Admission medications   Medication Sig Start Date End Date Taking? Authorizing Provider  Multiple Vitamins-Calcium (ONE-A-DAY WOMENS PO) Take by  mouth.   Yes Historical Provider, MD   Meds Ordered and Administered this Visit  Medications - No data to display  BP 134/79 mmHg  Pulse 84  Temp(Src) 98.8 F (37.1 C) (Oral)  Resp 16  SpO2 100%  LMP 05/20/2015 No data found.   Physical Exam  Constitutional: She is oriented to person, place, and time. She appears well-developed and well-nourished. No distress.  body habitus: Thin, trim nl weight.  HENT:  Head: Normocephalic and atraumatic.  Eyes: EOM are normal.  Neck: Normal range of motion. Neck supple.  Cardiovascular: Normal rate.   Pulmonary/Chest: She is in respiratory distress.  Musculoskeletal:  THere is tenderness to the bilateral hips over the ventral gluteal musculature as well as over the right or trochanter and proximal lateral thigh musculature. With abduction there is a palpable "popping" over the lateral hip joint. No tenderness over the upper buttocks. No tenderness to the anterior thighs. Strength is 5 over 5 bilaterally. No distal edema. No distal neurovascular, motor sensory deficit.  Neurological: She is alert and oriented to person, place, and time. No cranial nerve deficit.  Skin: Skin is warm and dry.  Psychiatric: She has a normal mood and affect. Her behavior is normal.  Nursing note and vitals reviewed.   ED Course  Procedures (including critical care time)  Labs Review Labs Reviewed - No data to display  Imaging Review No results found.   Visual Acuity Review  Right Eye Distance:   Left Eye  Distance:   Bilateral Distance:    Right Eye Near:   Left Eye Near:    Bilateral Near:         MDM   1. Hip pain, unspecified laterality    The history and physical exam was obtained by the provider. A pelvic x-ray was ordered. While the patient was waiting her job called her and told her that she needed to come to work immediately. Patient states she was unable to weight and have her x-rays performed. She is not having any acute problems.  She is fully ambulatory with weightbearing. She states she will come back later today. The patient left without instructions or diagnosis/etiology for her hip pain. She also stated she needed to check the status of her Medicaid. The visit was incomplete. Her condition is good/stable.She states she will return at a later time.    Hayden Rasmussenavid Albena Comes, NP 08/19/15 1455

## 2015-08-19 NOTE — ED Notes (Signed)
Patient was not forthcoming with knowledged of [redacted] weeks pregnant.

## 2015-08-19 NOTE — ED Notes (Signed)
Patient approached multiple staff members about a note for work.  Assured patient she would receive a note on completion of providers exam.  Explained patient could not get a note without physician examination.

## 2015-08-25 ENCOUNTER — Encounter (HOSPITAL_COMMUNITY): Payer: Self-pay | Admitting: Emergency Medicine

## 2015-08-25 DIAGNOSIS — N939 Abnormal uterine and vaginal bleeding, unspecified: Secondary | ICD-10-CM | POA: Diagnosis present

## 2015-08-25 DIAGNOSIS — N938 Other specified abnormal uterine and vaginal bleeding: Secondary | ICD-10-CM | POA: Diagnosis not present

## 2015-08-25 LAB — URINALYSIS, ROUTINE W REFLEX MICROSCOPIC
Bilirubin Urine: NEGATIVE
GLUCOSE, UA: NEGATIVE mg/dL
KETONES UR: NEGATIVE mg/dL
LEUKOCYTES UA: NEGATIVE
NITRITE: NEGATIVE
PROTEIN: NEGATIVE mg/dL
Specific Gravity, Urine: 1.025 (ref 1.005–1.030)
pH: 5.5 (ref 5.0–8.0)

## 2015-08-25 LAB — BASIC METABOLIC PANEL
Anion gap: 11 (ref 5–15)
BUN: 10 mg/dL (ref 6–20)
CHLORIDE: 105 mmol/L (ref 101–111)
CO2: 23 mmol/L (ref 22–32)
CREATININE: 0.72 mg/dL (ref 0.44–1.00)
Calcium: 9.2 mg/dL (ref 8.9–10.3)
GFR calc non Af Amer: >60 mL/min (ref >60)
Glucose, Bld: 119 mg/dL — ABNORMAL HIGH (ref 65–99)
POTASSIUM: 3.1 mmol/L — AB (ref 3.5–5.1)
Sodium: 139 mmol/L (ref 135–145)

## 2015-08-25 LAB — SAMPLE TO BLOOD BANK

## 2015-08-25 LAB — CBC WITH DIFFERENTIAL/PLATELET
Basophils Absolute: 0 10*3/uL (ref 0.0–0.1)
Basophils Relative: 1 %
Eosinophils Absolute: 0 10*3/uL (ref 0.0–0.7)
Eosinophils Relative: 0 %
HEMATOCRIT: 39 % (ref 36.0–46.0)
HEMOGLOBIN: 13.6 g/dL (ref 12.0–15.0)
LYMPHS ABS: 3.1 10*3/uL (ref 0.7–4.0)
Lymphocytes Relative: 46 %
MCH: 30.2 pg (ref 26.0–34.0)
MCHC: 34.9 g/dL (ref 30.0–36.0)
MCV: 86.5 fL (ref 78.0–100.0)
MONOS PCT: 5 %
Monocytes Absolute: 0.4 10*3/uL (ref 0.1–1.0)
NEUTROS ABS: 3.2 10*3/uL (ref 1.7–7.7)
NEUTROS PCT: 48 %
Platelets: 295 10*3/uL (ref 150–400)
RBC: 4.51 MIL/uL (ref 3.87–5.11)
RDW: 12.8 % (ref 11.5–15.5)
WBC: 6.8 10*3/uL (ref 4.0–10.5)

## 2015-08-25 LAB — URINE MICROSCOPIC-ADD ON

## 2015-08-25 LAB — I-STAT BETA HCG BLOOD, ED (MC, WL, AP ONLY): I-stat hCG, quantitative: <5 m[IU]/mL (ref <5)

## 2015-08-25 LAB — HCG, QUANTITATIVE, PREGNANCY: hCG, Beta Chain, Quant, S: <1 m[IU]/mL (ref <5)

## 2015-08-25 NOTE — ED Notes (Signed)
Pt. reports vaginal bleeding onset last month with intermittent low abdominal cramping , pt. stated she is pregnant but unsure of AOG . Denies fever or chills. No nausea or vomitting .

## 2015-08-26 ENCOUNTER — Emergency Department (HOSPITAL_COMMUNITY)
Admission: EM | Admit: 2015-08-26 | Discharge: 2015-08-26 | Disposition: A | Discharge status: Home / Self Care | Payer: Medicaid Other | Attending: Emergency Medicine | Admitting: Emergency Medicine

## 2015-08-29 ENCOUNTER — Ambulatory Visit (INDEPENDENT_AMBULATORY_CARE_PROVIDER_SITE_OTHER): Payer: Medicaid Other | Admitting: Nurse Practitioner

## 2015-08-29 ENCOUNTER — Encounter: Payer: Self-pay | Admitting: Nurse Practitioner

## 2015-08-29 VITALS — BP 100/74 | HR 88 | Ht 62.75 in | Wt 112.0 lb

## 2015-08-29 DIAGNOSIS — O039 Complete or unspecified spontaneous abortion without complication: Secondary | ICD-10-CM

## 2015-08-29 NOTE — Patient Instructions (Signed)
If further vaginal bleeding beyond next week to call.

## 2015-08-29 NOTE — Progress Notes (Signed)
25 y.o. Single African American female G1P0 here for follow up of SAB that occurred a week ago.  [facility]and had pelvic US that showed no products of conception and that miscarriage was complete.  She has had some vaginal bleeding or spotting since.  Her serum quant is normal.  She has many questions as to why and how this would happen.  She has been on the Internet and asked many questions about PCOS, Endometriosis, and other hormonal abnormalities.  She does not want another hormonal form of birth control at this time - feels that her 'emotions are all over the place'.  She plans on using condoms for birth control.    O: Healthy [place],[facility] female Affect:  Tense and tearful at times Abdomen:soft, non tender, normal bowel sounds Pelvic exam:EXTERNAL GENITALIA: normal appearing vulva with no masses, tenderness or lesions VAGINA: no abnormal discharge or lesions CERVIX: no lesions or cervical motion tenderness UTERUS: gravid and anteverted ADNEXA: no masses palpable and non tender  A: SAB    P:  Discussed findings of PUS - was able to bring up report and show her the results  Discussed serum HCG, normal menses after miscarriage, BUM, pictures of abnormal ovaries for PCOS and how hers is normal.  Showed her pictures of OV cyst and how hers is normal.  Answered a discussed questions about Endometriosis and she does not appear to have this.  Dispelled a lot of information that she read on line.she declines birth control at this time but feels free to call back when she is ready to start OCP - does not want Nexplanon, IUD, Nuva ring.   Labs :  None indicated  Instructions given regarding: to call back if she continued to bleed by mid week.  RV

## 2015-08-30 NOTE — Progress Notes (Signed)
Encounter reviewed by Dr. Chett Taniguchi Amundson C. Silva.  

## 2016-02-05 ENCOUNTER — Encounter: Payer: Self-pay | Admitting: Nurse Practitioner

## 2016-03-10 ENCOUNTER — Encounter: Payer: Self-pay | Admitting: Obstetrics and Gynecology

## 2016-04-23 ENCOUNTER — Encounter (HOSPITAL_COMMUNITY): Payer: Self-pay | Admitting: Emergency Medicine

## 2016-04-23 ENCOUNTER — Emergency Department (HOSPITAL_COMMUNITY)
Admission: EM | Admit: 2016-04-23 | Discharge: 2016-04-23 | Disposition: A | Discharge status: Home / Self Care | Payer: Medicaid Other | Attending: Emergency Medicine | Admitting: Emergency Medicine

## 2016-04-23 DIAGNOSIS — J029 Acute pharyngitis, unspecified: Secondary | ICD-10-CM

## 2016-04-23 DIAGNOSIS — J020 Streptococcal pharyngitis: Secondary | ICD-10-CM | POA: Diagnosis not present

## 2016-04-23 LAB — RAPID STREP SCREEN (MED CTR MEBANE ONLY): STREPTOCOCCUS, GROUP A SCREEN (DIRECT): POSITIVE — AB

## 2016-04-23 MED ORDER — AZITHROMYCIN 250 MG PO TABS
500.0000 mg | ORAL_TABLET | Freq: Once | ORAL | Status: AC
  Administered 2016-04-23: 500 mg via ORAL
  Filled 2016-04-23: qty 2

## 2016-04-23 MED ORDER — AZITHROMYCIN 500 MG PO TABS: 500.0000 mg | ORAL_TABLET | Freq: Every day | ORAL | 0 refills | Status: AC

## 2016-04-23 MED ORDER — DEXAMETHASONE SODIUM PHOSPHATE 10 MG/ML IJ SOLN
10.0000 mg | Freq: Once | INTRAMUSCULAR | Status: AC
  Administered 2016-04-23: 10 mg via INTRAMUSCULAR
  Filled 2016-04-23: qty 1

## 2016-04-23 NOTE — Discharge Instructions (Signed)
1. Medications: Azithromycin, usual home medications 2. Treatment: rest, drink plenty of fluids,  3. Follow Up: Please followup with your primary doctor in 3-5 days for discussion of your diagnoses and further evaluation after today's visit; if you do not have a primary care doctor use the resource guide provided to find one; Please return to the ER for worsening symptoms, difficulty swallowing, difficulty breathing or other concerns.

## 2016-04-23 NOTE — ED Triage Notes (Signed)
Patient has a sore throat for two weeks. Patient has white spots on the back of her throat. Patient complain of pain.

## 2016-04-23 NOTE — ED Provider Notes (Signed)
[facility]-EMERGENCY DEPT Provider Note   CSN: [id number] Arrival date & time: 04/23/16  2024  By signing my name below, I, Bobbie StackChristopher Reid, attest that this documentation has been prepared under the direction and in the presence of [employer] Corp[employer]annah Tearah Saulsbury, PA-C. Electronically Signed: Bobbie Stackhristopher Reid, Scribe. 04/23/16. 10:07 PM. History   Chief Complaint Chief Complaint  Patient presents with  . Sore Throat     The history is provided by the patient and medical records. No language interpreter was used.   HPI Comments: Teresa Velez is a 25 y.o. female who presents to the Emergency Department complaining of a sore throat for the last 2 weeks. She reports that her symptoms went away but returned 2 days ago. She has a history of recurrent tonsillitis but has not been treated for this episode. She reports associated chills and subjective fever. She has not measured her temperature at home. She denies nausea, vomiting, and pain in her ears.  Past Medical History:  Diagnosis Date  . Migraines   . STD (sexually transmitted disease)    chlamydia treated 2011, HSV 2  . Trichomonal vaginitis 11/01/14    There are no active problems to display for this patient.   Past Surgical History:  Procedure Laterality Date  . COLPOSCOPY  5/09   CINI    OB History    Gravida Para Term Preterm AB Living   2             SAB TAB Ectopic Multiple Live Births                   Home Medications    Prior to Admission medications   Medication Sig Start Date End Date Taking? Authorizing Provider  azithromycin (ZITHROMAX) 500 MG tablet Take 1 tablet (500 mg total) by mouth daily. Take first 2 tablets together, then 1 every day until finished. 04/23/16 04/28/16  Dahlia ClientHannah Carys Malina, PA-C    Family History Family History  Problem Relation Age of Onset  . Hypertension Maternal Grandmother   . Hypertension Paternal Grandmother     Social History Social History  Substance Use Topics  . Smoking  status: Never Smoker  . Smokeless tobacco: Never Used  . Alcohol use Yes     Allergies   Amoxicillin; Penicillins; and Flagyl [metronidazole]   Review of Systems Review of Systems  Constitutional: Positive for chills and fever ( subjective).  HENT: Positive for sore throat. Negative for ear pain, postnasal drip and rhinorrhea.   Respiratory: Negative for cough.   Cardiovascular: Negative for chest pain.  Gastrointestinal: Negative for nausea and vomiting.  Musculoskeletal: Negative for neck pain and neck stiffness.  Skin: Negative for rash.  Allergic/Immunologic: Negative for immunocompromised state.  Neurological: Negative for headaches.  Hematological: Positive for adenopathy.     Physical Exam Updated Vital Signs BP 141/83 (BP Location: Left Arm)   Pulse 85   Temp 98.6 F (37 C) (Oral)   Resp 20   Ht 5\' 3"  (1.6 m)   Wt 53.1 kg   LMP 03/18/2016 (Approximate)   SpO2 100%   BMI 20.73 kg/m   Physical Exam  Constitutional: She appears well-developed and well-nourished. No distress.  HENT:  Head: Normocephalic and atraumatic.  Right Ear: Tympanic membrane, external ear and ear canal normal.  Left Ear: Tympanic membrane, external ear and ear canal normal.  Nose: Nose normal. No mucosal edema or rhinorrhea.  Mouth/Throat: Uvula is midline and mucous membranes are normal. Mucous membranes are not dry. No trismus  in the jaw. No uvula swelling. Oropharyngeal exudate ( bilateral), posterior oropharyngeal edema and posterior oropharyngeal erythema present. No tonsillar abscesses.  Posterior oropharynx with erythema, edema and exudate on the tonsils  Eyes: Conjunctivae are normal.  Neck: Normal range of motion, full passive range of motion without pain and phonation normal. No tracheal tenderness, no spinous process tenderness and no muscular tenderness present. No neck rigidity. No erythema and normal range of motion present. No Brudzinski's sign and no Kernig's sign noted.    Range of motion without pain  No midline or paraspinal tenderness Normal phonation No stridor Handling secretions without difficulty No nuchal rigidity or meningeal signs  Cardiovascular: Normal rate, regular rhythm and normal heart sounds.   Pulses:      Radial pulses are 2+ on the right side, and 2+ on the left side.  Pulmonary/Chest: Effort normal and breath sounds normal. No stridor. No respiratory distress. She has no decreased breath sounds. She has no wheezes.  Equal chest expansion, clear and equal breath sounds without focal wheezes, rhonchi or rales  Musculoskeletal: Normal range of motion.  Lymphadenopathy:       Head (right side): Submandibular and tonsillar adenopathy present. No submental, no preauricular, no posterior auricular and no occipital adenopathy present.       Head (left side): Submandibular and tonsillar adenopathy present. No submental, no preauricular, no posterior auricular and no occipital adenopathy present.    She has cervical adenopathy (superfical, anterior).       Right cervical: No superficial cervical, no deep cervical and no posterior cervical adenopathy present.      Left cervical: No superficial cervical, no deep cervical and no posterior cervical adenopathy present.  Neurological: She is alert.  Alert and oriented Moves all extremities without ataxia  Skin: Skin is warm and dry. She is not diaphoretic.  Psychiatric: She has a normal mood and affect.  Nursing note and vitals reviewed.    ED Treatments / Results  DIAGNOSTIC STUDIES: Oxygen Saturation is 100% on RA, normal by my interpretation.    COORDINATION OF CARE: 10:08 PM Discussed treatment plan with pt at bedside and pt agreed to plan.   Labs (all labs ordered are listed, but only abnormal results are displayed) Labs Reviewed  RAPID STREP SCREEN (NOT AT [facility]) - Abnormal; Notable for the following:       Result Value   Streptococcus, Group A Screen (Direct) POSITIVE (*)    All  other components within normal limits    Procedures Procedures (including critical care time)  Medications Ordered in ED Medications  dexamethasone (DECADRON) injection 10 mg (not administered)  azithromycin (ZITHROMAX) tablet 500 mg (not administered)     Initial Impression / Assessment and Plan / ED Course  I have reviewed the triage vital signs and the nursing notes.  Pertinent labs & imaging results that were available during my care of the patient were reviewed by me and considered in my medical decision making (see chart for details).  Clinical Course as of Apr 24 2227  Fri Apr 23, 2016  2223 Positive Streptococcus, Group A Screen (Direct): (!) POSITIVE [HM]  2223 Afebrile Temp: 98.6 F (37 C) [HM]    Clinical Course User Index [HM] Dahlia ClientHannah Hiroyuki Ozanich, PA-C    Pt febrile with tonsillar exudate, cervical lymphadenopathy, & dysphagia; diagnosis of strep. Treated in the ED with steroids.  Patient is allergic to penicillin, will treat with azithromycin..  Pt appears mildly dehydrated, discussed importance of water rehydration. Presentation non concerning  for PTA or infxn spread to soft tissue. No trismus or uvula deviation. Specific return precautions discussed. Pt able to drink water in ED without difficulty with intact air way. Recommended PCP follow up.   Final Clinical Impressions(s) / ED Diagnoses   Final diagnoses:  Strep pharyngitis  Sore throat    New Prescriptions New Prescriptions   AZITHROMYCIN (ZITHROMAX) 500 MG TABLET    Take 1 tablet (500 mg total) by mouth daily. Take first 2 tablets together, then 1 every day until finished.   I personally performed the services described in this documentation, which was scribed in my presence. The recorded information has been reviewed and is accurate.    Dierdre Forth, PA-C 04/23/16 2228    Nira Conn, MD 04/24/16 [phone]

## 2018-05-03 NOTE — L&D Delivery Note (Signed)
Delivery Note Labor onset: 04/10/2019  Labor Onset Time: 2000 Complete dilation at 1:50 AM  Onset of pushing at 0153 FHR second stage Cat 1 Analgesia/Anesthesia intrapartum: IV sedation  Controlled pushing with maternal urge. Delivery of a viable female at 0202. Fetal head delivered in OA position and restituted to LOA. Nuchal cord none. Infant placed on maternal abd, dried, and tactile stim.  Cord double clamped and cut by Barbaraann Share, pt's mother.  Cord blood sample collected Arterial cord blood sample N/A.  Placenta delivered Schultz, intact, with short, 3 VC.  Placenta to pathology for IUGR. Uterine tone firm bleeding scant  No laceration identified.  Anesthesia: N/A Repair: N/A QBL/EBL (mL): Complications: None APGAR: APGAR (1 MIN): 9   APGAR (5 MINS): 9   APGAR (10 MINS):   Mom to postpartum.  Baby to Couplet care / Skin to Skin.  Teresa Eastern MSN, CNM 04/10/2019, 3:11 AM

## 2018-10-26 LAB — OB RESULTS CONSOLE RUBELLA ANTIBODY, IGM: Rubella: IMMUNE

## 2018-10-26 LAB — OB RESULTS CONSOLE ANTIBODY SCREEN: Antibody Screen: NEGATIVE

## 2018-10-26 LAB — OB RESULTS CONSOLE HEPATITIS B SURFACE ANTIGEN: Hepatitis B Surface Ag: NEGATIVE

## 2018-10-26 LAB — OB RESULTS CONSOLE ABO/RH: RH Type: POSITIVE

## 2018-10-26 LAB — OB RESULTS CONSOLE GC/CHLAMYDIA
Chlamydia: NEGATIVE
Gonorrhea: NEGATIVE

## 2018-10-26 LAB — OB RESULTS CONSOLE HIV ANTIBODY (ROUTINE TESTING): HIV: NONREACTIVE

## 2018-10-26 LAB — OB RESULTS CONSOLE RPR: RPR: NONREACTIVE

## 2018-10-30 ENCOUNTER — Encounter: Payer: Self-pay | Admitting: Advanced Practice Midwife

## 2018-12-15 ENCOUNTER — Other Ambulatory Visit (HOSPITAL_COMMUNITY): Payer: Self-pay | Admitting: Obstetrics and Gynecology

## 2018-12-15 DIAGNOSIS — Z3A21 21 weeks gestation of pregnancy: Secondary | ICD-10-CM

## 2018-12-15 DIAGNOSIS — Z3689 Encounter for other specified antenatal screening: Secondary | ICD-10-CM

## 2018-12-18 ENCOUNTER — Encounter (HOSPITAL_COMMUNITY): Payer: Self-pay | Admitting: *Deleted

## 2018-12-21 ENCOUNTER — Ambulatory Visit (HOSPITAL_COMMUNITY)
Admission: RE | Admit: 2018-12-21 | Discharge: 2018-12-21 | Disposition: A | Discharge status: Home / Self Care | Payer: Medicaid Other | Source: Ambulatory Visit | Attending: Obstetrics and Gynecology | Admitting: Obstetrics and Gynecology

## 2018-12-21 ENCOUNTER — Ambulatory Visit (HOSPITAL_COMMUNITY): Payer: Medicaid Other

## 2018-12-21 HISTORY — DX: Unspecified abnormal cytological findings in specimens from vagina: R87.629

## 2018-12-29 ENCOUNTER — Other Ambulatory Visit (HOSPITAL_COMMUNITY): Payer: Self-pay | Admitting: Obstetrics and Gynecology

## 2018-12-29 ENCOUNTER — Other Ambulatory Visit (HOSPITAL_COMMUNITY): Payer: Self-pay | Admitting: *Deleted

## 2018-12-29 ENCOUNTER — Ambulatory Visit (HOSPITAL_COMMUNITY)
Admission: RE | Admit: 2018-12-29 | Discharge: 2018-12-29 | Disposition: A | Discharge status: Home / Self Care | Payer: Medicaid Other | Source: Ambulatory Visit | Attending: Obstetrics and Gynecology | Admitting: Obstetrics and Gynecology

## 2018-12-29 ENCOUNTER — Ambulatory Visit (HOSPITAL_COMMUNITY): Payer: Medicaid Other | Admitting: *Deleted

## 2018-12-29 ENCOUNTER — Other Ambulatory Visit: Payer: Self-pay

## 2018-12-29 VITALS — BP 127/59 | HR 85 | Temp 98.5°F | Wt 146.0 lb

## 2018-12-29 DIAGNOSIS — Z3A21 21 weeks gestation of pregnancy: Secondary | ICD-10-CM | POA: Diagnosis present

## 2018-12-29 DIAGNOSIS — O365910 Maternal care for other known or suspected poor fetal growth, first trimester, not applicable or unspecified: Secondary | ICD-10-CM

## 2018-12-29 DIAGNOSIS — Z3689 Encounter for other specified antenatal screening: Secondary | ICD-10-CM | POA: Insufficient documentation

## 2018-12-29 DIAGNOSIS — O0990 Supervision of high risk pregnancy, unspecified, unspecified trimester: Secondary | ICD-10-CM | POA: Diagnosis present

## 2018-12-29 DIAGNOSIS — Z3A22 22 weeks gestation of pregnancy: Secondary | ICD-10-CM

## 2018-12-29 DIAGNOSIS — O365990 Maternal care for other known or suspected poor fetal growth, unspecified trimester, not applicable or unspecified: Secondary | ICD-10-CM

## 2018-12-29 NOTE — Consult Note (Signed)
MFM Consultation:  Requesting Provider:  Date of Service: 12/29/18 Reason for Request: Intrauterine growth restriction   Teresa Velez is 79 y G2P0 at 22 wk and 4 days she is seen at the request for ultrasound and consultation regarding diagnosis of IUGR.   On your exam at 20w 2d you documented an EFW of 6% and AC at 5% with Normal UA Dopplers. She doesn't have risk factors such as Family history, chronic disease, substance abuse, infection or medication exposure.  Normal AFP, Normal Cell free DNA per client reports.  Teresa Velez denies vaginal bleeding, loss of fluid or uterine contractions.  Past Medical History:  Diagnosis Date  . Migraines   . STD (sexually transmitted disease)    chlamydia treated 2011, HSV 2  . Trichomonal vaginitis 11/01/14  . Vaginal Pap smear, abnormal    Past Surgical History:  Procedure Laterality Date  . COLPOSCOPY  5/09   CINI   Family History  Problem Relation Age of Onset  . Hypertension Maternal Grandmother   . Hypertension Paternal Grandmother    Relationships  Social connections  . Talks on phone: Not on file  . Gets together: Not on file  . Attends religious service: Not on file  . Active member of club or organization: Not on file  . Attends meetings of clubs or organizations: Not on file  . Relationship status: Not on file   OB History  Gravida Para Term Preterm AB Living  2 0 0 0 0 0  SAB TAB Ectopic Multiple Live Births  0 0 0 0 0    # Outcome Date GA Lbr Len/2nd Weight Sex Delivery Anes PTL Lv  2 Current           1 Gravida            No current outpatient medications on file prior to encounter.   No current facility-administered medications on file prior to encounter.     Normal Labs  Impression/Counseling:  New diagnosis of IUGR EFW at the 10th% AC at the 19th%. Normal UA Dopplers.  I reviewed with Teresa Velez the diagnosis, evaluation and management of intrauterine growth restriction. We reviewed the above causes see  above but include also placental insufficiency. We also note that ultrasound biometry package can play a role in the differences in calculations as per our report we calculated the EFW at 10% and AC at 19th%.   We discussed the risk of still birth, preterm delivery and Non-reassuring fetal heart tones resulting in urgent delivery and/or emergent delivery via cesarean delivery.  At this time we recommend UA Doppler assessment every 2 weeks until they become abnormal or until 26-28 weeks then weekly assessments.   At 32 weeks initiate weekly BPP with UA Dopplers  Delivery at 39 weeks unless the EFW decreases to 3% or less, then delivery at 37 weeks.  I spent 30 minutes with Teresa Velez with >50% in face to face consultation and care coordination.  All questions answered.  Thank you for allowing [place] to participate in the care of your patient.  Follow up UA Doppler was scheduled in 2 weeks with repeat growth in 4 weeks. If you would like to change this schedule and prefer to have the appointment at your offices please don't hesitate to contact us.

## 2019-01-09 ENCOUNTER — Encounter (HOSPITAL_COMMUNITY): Payer: Self-pay | Admitting: *Deleted

## 2019-01-09 ENCOUNTER — Ambulatory Visit (HOSPITAL_COMMUNITY): Payer: Medicaid Other | Admitting: *Deleted

## 2019-01-09 ENCOUNTER — Other Ambulatory Visit: Payer: Self-pay

## 2019-01-09 ENCOUNTER — Ambulatory Visit (HOSPITAL_COMMUNITY)
Admission: RE | Admit: 2019-01-09 | Discharge: 2019-01-09 | Disposition: A | Discharge status: Home / Self Care | Payer: Medicaid Other | Source: Ambulatory Visit | Attending: Obstetrics and Gynecology | Admitting: Obstetrics and Gynecology

## 2019-01-09 VITALS — BP 132/71 | HR 101 | Temp 98.5°F

## 2019-01-09 DIAGNOSIS — O365920 Maternal care for other known or suspected poor fetal growth, second trimester, not applicable or unspecified: Secondary | ICD-10-CM | POA: Diagnosis not present

## 2019-01-09 DIAGNOSIS — O365990 Maternal care for other known or suspected poor fetal growth, unspecified trimester, not applicable or unspecified: Secondary | ICD-10-CM

## 2019-01-09 DIAGNOSIS — Z3A24 24 weeks gestation of pregnancy: Secondary | ICD-10-CM | POA: Diagnosis not present

## 2019-01-23 ENCOUNTER — Other Ambulatory Visit (HOSPITAL_COMMUNITY): Payer: Self-pay | Admitting: Maternal & Fetal Medicine

## 2019-01-23 ENCOUNTER — Ambulatory Visit (HOSPITAL_COMMUNITY): Payer: Medicaid Other | Admitting: *Deleted

## 2019-01-23 ENCOUNTER — Other Ambulatory Visit (HOSPITAL_COMMUNITY): Payer: Self-pay | Admitting: *Deleted

## 2019-01-23 ENCOUNTER — Ambulatory Visit (HOSPITAL_COMMUNITY)
Admission: RE | Admit: 2019-01-23 | Discharge: 2019-01-23 | Disposition: A | Discharge status: Home / Self Care | Payer: Medicaid Other | Source: Ambulatory Visit | Attending: Obstetrics and Gynecology | Admitting: Obstetrics and Gynecology

## 2019-01-23 ENCOUNTER — Other Ambulatory Visit: Payer: Self-pay

## 2019-01-23 ENCOUNTER — Encounter (HOSPITAL_COMMUNITY): Payer: Self-pay

## 2019-01-23 VITALS — BP 121/75 | HR 84 | Temp 98.5°F

## 2019-01-23 DIAGNOSIS — Z362 Encounter for other antenatal screening follow-up: Secondary | ICD-10-CM | POA: Diagnosis not present

## 2019-01-23 DIAGNOSIS — O365990 Maternal care for other known or suspected poor fetal growth, unspecified trimester, not applicable or unspecified: Secondary | ICD-10-CM

## 2019-01-23 DIAGNOSIS — O365920 Maternal care for other known or suspected poor fetal growth, second trimester, not applicable or unspecified: Secondary | ICD-10-CM | POA: Diagnosis not present

## 2019-01-23 DIAGNOSIS — O365930 Maternal care for other known or suspected poor fetal growth, third trimester, not applicable or unspecified: Secondary | ICD-10-CM

## 2019-01-23 DIAGNOSIS — Z3A26 26 weeks gestation of pregnancy: Secondary | ICD-10-CM | POA: Diagnosis not present

## 2019-02-01 ENCOUNTER — Encounter (HOSPITAL_COMMUNITY): Payer: Self-pay | Admitting: Obstetrics and Gynecology

## 2019-02-13 ENCOUNTER — Other Ambulatory Visit (HOSPITAL_COMMUNITY): Payer: Self-pay | Admitting: *Deleted

## 2019-02-13 ENCOUNTER — Ambulatory Visit (HOSPITAL_COMMUNITY): Payer: Medicaid Other | Admitting: *Deleted

## 2019-02-13 ENCOUNTER — Ambulatory Visit (HOSPITAL_COMMUNITY)
Admission: RE | Admit: 2019-02-13 | Discharge: 2019-02-13 | Disposition: A | Discharge status: Home / Self Care | Payer: Medicaid Other | Source: Ambulatory Visit | Attending: Maternal & Fetal Medicine | Admitting: Maternal & Fetal Medicine

## 2019-02-13 ENCOUNTER — Other Ambulatory Visit: Payer: Self-pay

## 2019-02-13 ENCOUNTER — Encounter (HOSPITAL_COMMUNITY): Payer: Self-pay | Admitting: *Deleted

## 2019-02-13 VITALS — BP 129/73 | HR 98 | Temp 98.7°F

## 2019-02-13 DIAGNOSIS — O365990 Maternal care for other known or suspected poor fetal growth, unspecified trimester, not applicable or unspecified: Secondary | ICD-10-CM

## 2019-02-13 DIAGNOSIS — Z3A29 29 weeks gestation of pregnancy: Secondary | ICD-10-CM | POA: Diagnosis not present

## 2019-02-13 DIAGNOSIS — O365930 Maternal care for other known or suspected poor fetal growth, third trimester, not applicable or unspecified: Secondary | ICD-10-CM | POA: Diagnosis present

## 2019-02-13 DIAGNOSIS — O365920 Maternal care for other known or suspected poor fetal growth, second trimester, not applicable or unspecified: Secondary | ICD-10-CM | POA: Diagnosis not present

## 2019-02-13 DIAGNOSIS — Z362 Encounter for other antenatal screening follow-up: Secondary | ICD-10-CM | POA: Diagnosis not present

## 2019-02-13 NOTE — Progress Notes (Signed)
[phone]  

## 2019-02-13 NOTE — Procedures (Signed)
Teresa Velez [birth year] [redacted]w[redacted]d  Fetus A Non-Stress Test Interpretation for 02/13/19  Indication: IUGR  Fetal Heart Rate A Mode: External Baseline Rate (A): 150 bpm Variability: Moderate Accelerations: 10 x 10 Decelerations: None Multiple birth?: No  Uterine Activity Mode: Palpation, Toco Contraction Frequency (min): U/I Contraction Duration (sec): 20-40 Contraction Quality: Mild(denies feeling any) Resting Tone Palpated: Relaxed Resting Time: Adequate  Interpretation (Fetal Testing) Nonstress Test Interpretation: Reactive Comments: Reviewed tracing with Dr. Gertie Exon

## 2019-02-14 LAB — OB RESULTS CONSOLE HIV ANTIBODY (ROUTINE TESTING): HIV: NONREACTIVE

## 2019-02-20 ENCOUNTER — Other Ambulatory Visit: Payer: Self-pay

## 2019-02-20 ENCOUNTER — Ambulatory Visit (HOSPITAL_COMMUNITY): Payer: Medicaid Other | Admitting: *Deleted

## 2019-02-20 ENCOUNTER — Encounter (HOSPITAL_COMMUNITY): Payer: Self-pay

## 2019-02-20 ENCOUNTER — Ambulatory Visit (HOSPITAL_COMMUNITY)
Admission: RE | Admit: 2019-02-20 | Discharge: 2019-02-20 | Disposition: A | Discharge status: Home / Self Care | Payer: Medicaid Other | Source: Ambulatory Visit | Attending: Maternal & Fetal Medicine | Admitting: Maternal & Fetal Medicine

## 2019-02-20 VITALS — BP 112/70 | HR 96 | Temp 98.6°F

## 2019-02-20 DIAGNOSIS — O365990 Maternal care for other known or suspected poor fetal growth, unspecified trimester, not applicable or unspecified: Secondary | ICD-10-CM | POA: Diagnosis present

## 2019-02-20 DIAGNOSIS — Z3A30 30 weeks gestation of pregnancy: Secondary | ICD-10-CM

## 2019-02-20 DIAGNOSIS — O365920 Maternal care for other known or suspected poor fetal growth, second trimester, not applicable or unspecified: Secondary | ICD-10-CM | POA: Diagnosis not present

## 2019-02-20 DIAGNOSIS — O365930 Maternal care for other known or suspected poor fetal growth, third trimester, not applicable or unspecified: Secondary | ICD-10-CM

## 2019-02-20 NOTE — Procedures (Signed)
Teresa Velez [birth year] [redacted]w[redacted]d  Fetus A Non-Stress Test Interpretation for 02/20/19  Indication: IUGR  Fetal Heart Rate A Mode: External Baseline Rate (A): 145 bpm Variability: Moderate Accelerations: 10 x 10 Decelerations: None Multiple birth?: No  Uterine Activity Mode: Toco Contraction Frequency (min): occ UC noted Contraction Duration (sec): 40-60 Contraction Quality: Mild Resting Tone Palpated: Relaxed Resting Time: Adequate  Interpretation (Fetal Testing) Nonstress Test Interpretation: Reactive Comments: FHR tracing rev'd by Dr. Annamaria Boots

## 2019-02-27 ENCOUNTER — Other Ambulatory Visit (HOSPITAL_COMMUNITY): Payer: Self-pay | Admitting: Maternal & Fetal Medicine

## 2019-02-27 ENCOUNTER — Ambulatory Visit (HOSPITAL_COMMUNITY): Payer: Medicaid Other

## 2019-02-27 ENCOUNTER — Ambulatory Visit (HOSPITAL_COMMUNITY): Payer: Medicaid Other | Admitting: *Deleted

## 2019-02-27 ENCOUNTER — Encounter (HOSPITAL_COMMUNITY): Payer: Self-pay

## 2019-02-27 ENCOUNTER — Other Ambulatory Visit: Payer: Self-pay

## 2019-02-27 ENCOUNTER — Ambulatory Visit (HOSPITAL_COMMUNITY)
Admission: RE | Admit: 2019-02-27 | Discharge: 2019-02-27 | Disposition: A | Discharge status: Home / Self Care | Payer: Medicaid Other | Source: Ambulatory Visit | Attending: Maternal & Fetal Medicine | Admitting: Maternal & Fetal Medicine

## 2019-02-27 VITALS — BP 123/71 | HR 93 | Temp 98.6°F

## 2019-02-27 DIAGNOSIS — O365932 Maternal care for other known or suspected poor fetal growth, third trimester, fetus 2: Secondary | ICD-10-CM | POA: Diagnosis not present

## 2019-02-27 DIAGNOSIS — O365990 Maternal care for other known or suspected poor fetal growth, unspecified trimester, not applicable or unspecified: Secondary | ICD-10-CM

## 2019-02-27 DIAGNOSIS — O365930 Maternal care for other known or suspected poor fetal growth, third trimester, not applicable or unspecified: Secondary | ICD-10-CM | POA: Diagnosis present

## 2019-02-27 DIAGNOSIS — Z3A31 31 weeks gestation of pregnancy: Secondary | ICD-10-CM | POA: Diagnosis not present

## 2019-03-06 ENCOUNTER — Other Ambulatory Visit: Payer: Self-pay

## 2019-03-06 ENCOUNTER — Encounter (HOSPITAL_COMMUNITY): Payer: Self-pay

## 2019-03-06 ENCOUNTER — Ambulatory Visit (HOSPITAL_COMMUNITY): Payer: Medicaid Other | Admitting: *Deleted

## 2019-03-06 ENCOUNTER — Other Ambulatory Visit (HOSPITAL_COMMUNITY): Payer: Self-pay | Admitting: *Deleted

## 2019-03-06 ENCOUNTER — Ambulatory Visit (HOSPITAL_COMMUNITY)
Admission: RE | Admit: 2019-03-06 | Discharge: 2019-03-06 | Disposition: A | Discharge status: Home / Self Care | Payer: Medicaid Other | Source: Ambulatory Visit | Attending: Obstetrics and Gynecology | Admitting: Obstetrics and Gynecology

## 2019-03-06 VITALS — BP 125/68 | HR 95 | Temp 98.9°F

## 2019-03-06 DIAGNOSIS — O365932 Maternal care for other known or suspected poor fetal growth, third trimester, fetus 2: Secondary | ICD-10-CM | POA: Diagnosis not present

## 2019-03-06 DIAGNOSIS — O365930 Maternal care for other known or suspected poor fetal growth, third trimester, not applicable or unspecified: Secondary | ICD-10-CM | POA: Diagnosis present

## 2019-03-06 DIAGNOSIS — Z362 Encounter for other antenatal screening follow-up: Secondary | ICD-10-CM | POA: Diagnosis not present

## 2019-03-06 DIAGNOSIS — Z3A32 32 weeks gestation of pregnancy: Secondary | ICD-10-CM

## 2019-03-06 DIAGNOSIS — O365990 Maternal care for other known or suspected poor fetal growth, unspecified trimester, not applicable or unspecified: Secondary | ICD-10-CM | POA: Insufficient documentation

## 2019-03-08 ENCOUNTER — Other Ambulatory Visit (HOSPITAL_COMMUNITY): Payer: Self-pay | Admitting: *Deleted

## 2019-03-08 DIAGNOSIS — O365930 Maternal care for other known or suspected poor fetal growth, third trimester, not applicable or unspecified: Secondary | ICD-10-CM

## 2019-03-13 ENCOUNTER — Ambulatory Visit (HOSPITAL_COMMUNITY): Payer: Medicaid Other | Admitting: *Deleted

## 2019-03-13 ENCOUNTER — Other Ambulatory Visit: Payer: Self-pay

## 2019-03-13 ENCOUNTER — Encounter (HOSPITAL_COMMUNITY): Payer: Self-pay | Admitting: *Deleted

## 2019-03-13 ENCOUNTER — Ambulatory Visit (HOSPITAL_COMMUNITY)
Admission: RE | Admit: 2019-03-13 | Discharge: 2019-03-13 | Disposition: A | Discharge status: Home / Self Care | Payer: Medicaid Other | Source: Ambulatory Visit | Attending: Obstetrics and Gynecology | Admitting: Obstetrics and Gynecology

## 2019-03-13 ENCOUNTER — Ambulatory Visit (HOSPITAL_COMMUNITY): Payer: Medicaid Other

## 2019-03-13 VITALS — BP 109/80 | HR 103 | Temp 98.7°F

## 2019-03-13 DIAGNOSIS — O365930 Maternal care for other known or suspected poor fetal growth, third trimester, not applicable or unspecified: Secondary | ICD-10-CM | POA: Diagnosis not present

## 2019-03-13 DIAGNOSIS — Z3A33 33 weeks gestation of pregnancy: Secondary | ICD-10-CM

## 2019-03-13 DIAGNOSIS — O365932 Maternal care for other known or suspected poor fetal growth, third trimester, fetus 2: Secondary | ICD-10-CM | POA: Diagnosis not present

## 2019-03-18 ENCOUNTER — Emergency Department (HOSPITAL_COMMUNITY)
Admission: EM | Admit: 2019-03-18 | Discharge: 2019-03-18 | Disposition: A | Discharge status: Home / Self Care | Payer: No Typology Code available for payment source | Attending: Emergency Medicine | Admitting: Emergency Medicine

## 2019-03-18 ENCOUNTER — Other Ambulatory Visit: Payer: Self-pay

## 2019-03-18 ENCOUNTER — Encounter (HOSPITAL_COMMUNITY): Payer: Self-pay

## 2019-03-18 DIAGNOSIS — R1084 Generalized abdominal pain: Secondary | ICD-10-CM | POA: Diagnosis not present

## 2019-03-18 NOTE — Discharge Instructions (Signed)
Follow-up as scheduled for your maternal-fetal medicine appointment.  Please return for worsening abdominal pain vaginal bleeding or gush of fluids.  Please return for shortness of breath.
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
## 2019-03-18 NOTE — ED Notes (Addendum)
Patient verbalizes understanding of discharge instructions. Opportunity for questioning and answers were provided. Armband removed by staff, pt discharged from ED.  

## 2019-03-19 ENCOUNTER — Encounter (HOSPITAL_COMMUNITY): Payer: Self-pay | Admitting: *Deleted

## 2019-03-19 ENCOUNTER — Encounter (HOSPITAL_COMMUNITY): Payer: Self-pay

## 2019-03-20 ENCOUNTER — Encounter (HOSPITAL_COMMUNITY): Payer: Self-pay

## 2019-03-20 ENCOUNTER — Ambulatory Visit (HOSPITAL_COMMUNITY): Payer: Medicaid Other

## 2019-03-20 ENCOUNTER — Ambulatory Visit (HOSPITAL_COMMUNITY)
Admission: RE | Admit: 2019-03-20 | Discharge: 2019-03-20 | Disposition: A | Discharge status: Home / Self Care | Payer: Medicaid Other | Source: Ambulatory Visit | Attending: Obstetrics and Gynecology | Admitting: Obstetrics and Gynecology

## 2019-03-20 ENCOUNTER — Other Ambulatory Visit: Payer: Self-pay

## 2019-03-20 ENCOUNTER — Ambulatory Visit (HOSPITAL_COMMUNITY): Payer: Medicaid Other | Admitting: *Deleted

## 2019-03-20 VITALS — BP 133/73 | HR 97 | Temp 97.8°F

## 2019-03-20 DIAGNOSIS — O365930 Maternal care for other known or suspected poor fetal growth, third trimester, not applicable or unspecified: Secondary | ICD-10-CM

## 2019-03-20 DIAGNOSIS — O365990 Maternal care for other known or suspected poor fetal growth, unspecified trimester, not applicable or unspecified: Secondary | ICD-10-CM | POA: Insufficient documentation

## 2019-03-20 DIAGNOSIS — Z3A34 34 weeks gestation of pregnancy: Secondary | ICD-10-CM | POA: Diagnosis not present

## 2019-03-26 ENCOUNTER — Telehealth (HOSPITAL_COMMUNITY): Payer: Self-pay | Admitting: *Deleted

## 2019-03-26 ENCOUNTER — Encounter (HOSPITAL_COMMUNITY): Payer: Self-pay | Admitting: *Deleted

## 2019-03-26 NOTE — Telephone Encounter (Signed)
Preadmission screen  

## 2019-03-27 ENCOUNTER — Ambulatory Visit (HOSPITAL_COMMUNITY)
Admission: RE | Admit: 2019-03-27 | Discharge: 2019-03-27 | Disposition: A | Discharge status: Home / Self Care | Payer: Medicaid Other | Source: Ambulatory Visit | Attending: Obstetrics and Gynecology | Admitting: Obstetrics and Gynecology

## 2019-03-27 ENCOUNTER — Ambulatory Visit (HOSPITAL_COMMUNITY): Payer: Medicaid Other | Admitting: *Deleted

## 2019-03-27 ENCOUNTER — Other Ambulatory Visit: Payer: Self-pay

## 2019-03-27 ENCOUNTER — Encounter (HOSPITAL_COMMUNITY): Payer: Self-pay

## 2019-03-27 ENCOUNTER — Other Ambulatory Visit (HOSPITAL_COMMUNITY): Payer: Self-pay | Admitting: *Deleted

## 2019-03-27 ENCOUNTER — Ambulatory Visit (HOSPITAL_COMMUNITY): Payer: Medicaid Other

## 2019-03-27 VITALS — BP 127/69 | HR 88 | Temp 97.8°F

## 2019-03-27 DIAGNOSIS — O365930 Maternal care for other known or suspected poor fetal growth, third trimester, not applicable or unspecified: Secondary | ICD-10-CM

## 2019-03-27 DIAGNOSIS — Z362 Encounter for other antenatal screening follow-up: Secondary | ICD-10-CM

## 2019-03-27 DIAGNOSIS — Z3A35 35 weeks gestation of pregnancy: Secondary | ICD-10-CM

## 2019-03-28 LAB — OB RESULTS CONSOLE GBS: GBS: NEGATIVE

## 2019-04-03 ENCOUNTER — Ambulatory Visit (HOSPITAL_COMMUNITY)
Admission: RE | Admit: 2019-04-03 | Discharge: 2019-04-03 | Disposition: A | Discharge status: Home / Self Care | Payer: Medicaid Other | Source: Ambulatory Visit | Attending: Obstetrics and Gynecology | Admitting: Obstetrics and Gynecology

## 2019-04-03 ENCOUNTER — Encounter (HOSPITAL_COMMUNITY): Payer: Self-pay | Admitting: *Deleted

## 2019-04-03 ENCOUNTER — Ambulatory Visit (HOSPITAL_COMMUNITY): Payer: Medicaid Other | Admitting: *Deleted

## 2019-04-03 ENCOUNTER — Other Ambulatory Visit: Payer: Self-pay

## 2019-04-03 VITALS — BP 135/69 | HR 98 | Temp 97.9°F

## 2019-04-03 DIAGNOSIS — O365930 Maternal care for other known or suspected poor fetal growth, third trimester, not applicable or unspecified: Secondary | ICD-10-CM | POA: Insufficient documentation

## 2019-04-03 DIAGNOSIS — O365990 Maternal care for other known or suspected poor fetal growth, unspecified trimester, not applicable or unspecified: Secondary | ICD-10-CM

## 2019-04-03 DIAGNOSIS — O365931 Maternal care for other known or suspected poor fetal growth, third trimester, fetus 1: Secondary | ICD-10-CM

## 2019-04-03 DIAGNOSIS — Z3A36 36 weeks gestation of pregnancy: Secondary | ICD-10-CM | POA: Diagnosis not present

## 2019-04-06 ENCOUNTER — Other Ambulatory Visit: Payer: Self-pay | Admitting: Obstetrics and Gynecology

## 2019-04-07 ENCOUNTER — Other Ambulatory Visit (HOSPITAL_COMMUNITY)
Admission: RE | Admit: 2019-04-07 | Discharge: 2019-04-07 | Disposition: A | Discharge status: Home / Self Care | Payer: Medicaid Other | Source: Ambulatory Visit | Attending: Obstetrics and Gynecology | Admitting: Obstetrics and Gynecology

## 2019-04-07 DIAGNOSIS — Z01812 Encounter for preprocedural laboratory examination: Secondary | ICD-10-CM | POA: Insufficient documentation

## 2019-04-07 DIAGNOSIS — Z20828 Contact with and (suspected) exposure to other viral communicable diseases: Secondary | ICD-10-CM | POA: Insufficient documentation

## 2019-04-07 LAB — SARS CORONAVIRUS 2 (TAT 6-24 HRS): SARS Coronavirus 2: NEGATIVE

## 2019-04-08 DIAGNOSIS — O365931 Maternal care for other known or suspected poor fetal growth, third trimester, fetus 1: Secondary | ICD-10-CM | POA: Diagnosis present

## 2019-04-08 NOTE — H&P (Signed)
Teresa Velez is a 28 y.o. female, G1P0 at 90 weeks, presenting for induction for IUGR.  Patient has had IUGR since 20 weeks and has been followed by MFM for growth [place] with antenatal testing at [facility].  A fetal echo was preformed during the pregnancy due to possible VSD but fetal echo was wnl.  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    Patient Active Problem List   Diagnosis Date Noted  . IUGR (intrauterine growth retardation) affecting mother, third trimester, fetus 1 04/08/2019    History of present pregnancy: Patient entered care at  15.2 weeks.   EDC of 04/30/2019 was established by LMP/US.   Anatomy scan: 20.2 weeks, with normal findings and an posterior placenta.  EFW noted to be 6% lagging AC 5% dopplers no absent flow. Additional [place] evaluations:  Every 2 weeks growth with MFM Last growth on 11/24 showed growth 12 percentile with normal doppler flow Last [place] on 12/2 BPP 8/8 with normal AFI Significant prenatal events:  IUGR since 20 weeks Last evaluation:  Last week  OB History    Gravida  1   Para  0   Term  0   Preterm  0   AB  0   Living        SAB  0   TAB  0   Ectopic  0   Multiple      Live Births             Past Medical History:  Diagnosis Date  . Migraines   . STD (sexually transmitted disease)    chlamydia treated 2011, HSV 2  . Trichomonal vaginitis 11/01/14  . Vaginal Pap smear, abnormal    Past Surgical History:  Procedure Laterality Date  . COLPOSCOPY  5/09   CINI   Family History: family history includes Hypertension in her maternal grandmother. Social History:  reports that she has never smoked. She has never used smokeless tobacco. She reports previous alcohol use. She reports previous drug use.   Prenatal Transfer Tool  Maternal Diabetes: No Genetic Screening: Normal Maternal Ultrasounds/Referrals: IUGR Fetal Ultrasounds or other Referrals:  Fetal echo, Referred to Materal Fetal Medicine  Maternal Substance  Abuse:  Yes smoker  Significant Maternal Medications:  Valtrex Significant Maternal Lab Results: None  TDAP Declined Flu Declined  ROS:  All 10 systems reviewed and negative except stated above  Allergies  Allergen Reactions  . Amoxicillin Hives  . Penicillins Hives  . Flagyl [Metronidazole] Itching and Rash       Last menstrual period 07/24/2018, unknown if currently breastfeeding.  Chest clear Heart RRR without murmur Abd gravid, NT, FH small Pelvic: 1/thick/-2 Ext: +2/+2 negative for edema  FHR: Category 145 variability present, accels noted UCs:  None  Prenatal labs: ABO, Rh: O/Positive/-- (06/25 0000) Antibody: Negative (06/25 0000) Rubella:  Immune (06/25 0000) RPR: Nonreactive (06/25 0000)  HBsAg: Negative (06/25 0000)  HIV: Non-reactive (06/25 0000)  GBS:  Negative Sickle cell/Hgb electrophoresis:  AA Pap:  08/02/2018 GC:  Neg Chlamydia:  Neg Genetic screenings:  NIPT negative AFP neg Glucola:  108 Other:   Hgb 11.9 at 28 weeks       Assessment/Plan: IUP at 37 weeks with IUGR  Plan: Admit to [facility]  Routine [facility] orders cytotec for ripening Pain med/epidural prn   Pleas Koch ProtheroCNM, MSN 04/08/2019, 8:27 PM

## 2019-04-09 ENCOUNTER — Inpatient Hospital Stay (HOSPITAL_COMMUNITY): Payer: Medicaid Other

## 2019-04-09 ENCOUNTER — Encounter (HOSPITAL_COMMUNITY): Payer: Self-pay

## 2019-04-09 ENCOUNTER — Other Ambulatory Visit: Payer: Self-pay

## 2019-04-09 ENCOUNTER — Inpatient Hospital Stay (HOSPITAL_COMMUNITY)
Admission: AD | Admit: 2019-04-09 | Discharge: 2019-04-12 | DRG: 806 | Disposition: A | Discharge status: Home / Self Care | Payer: Medicaid Other | Attending: Obstetrics & Gynecology | Admitting: Obstetrics & Gynecology

## 2019-04-09 DIAGNOSIS — A6000 Herpesviral infection of urogenital system, unspecified: Secondary | ICD-10-CM | POA: Diagnosis present

## 2019-04-09 DIAGNOSIS — Z3A37 37 weeks gestation of pregnancy: Secondary | ICD-10-CM

## 2019-04-09 DIAGNOSIS — Z23 Encounter for immunization: Secondary | ICD-10-CM

## 2019-04-09 DIAGNOSIS — O365990 Maternal care for other known or suspected poor fetal growth, unspecified trimester, not applicable or unspecified: Secondary | ICD-10-CM | POA: Diagnosis present

## 2019-04-09 DIAGNOSIS — O365931 Maternal care for other known or suspected poor fetal growth, third trimester, fetus 1: Secondary | ICD-10-CM | POA: Diagnosis present

## 2019-04-09 DIAGNOSIS — B009 Herpesviral infection, unspecified: Secondary | ICD-10-CM

## 2019-04-09 DIAGNOSIS — O9832 Other infections with a predominantly sexual mode of transmission complicating childbirth: Secondary | ICD-10-CM | POA: Diagnosis present

## 2019-04-09 DIAGNOSIS — O365930 Maternal care for other known or suspected poor fetal growth, third trimester, not applicable or unspecified: Secondary | ICD-10-CM | POA: Diagnosis present

## 2019-04-09 LAB — CBC
HCT: 35.1 % — ABNORMAL LOW (ref 36.0–46.0)
Hemoglobin: 11.7 g/dL — ABNORMAL LOW (ref 12.0–15.0)
MCH: 27.9 pg (ref 26.0–34.0)
MCHC: 33.3 g/dL (ref 30.0–36.0)
MCV: 83.8 fL (ref 80.0–100.0)
Platelets: 293 10*3/uL (ref 150–400)
RBC: 4.19 MIL/uL (ref 3.87–5.11)
RDW: 14 % (ref 11.5–15.5)
WBC: 8 10*3/uL (ref 4.0–10.5)
nRBC: 0 % (ref 0.0–0.2)

## 2019-04-09 MED ORDER — SOD CITRATE-CITRIC ACID 500-334 MG/5ML PO SOLN: 30.0000 mL | ORAL | Status: DC | PRN

## 2019-04-09 MED ORDER — PHENYLEPHRINE 40 MCG/ML (10ML) SYRINGE FOR IV PUSH (FOR BLOOD PRESSURE SUPPORT): 80.0000 ug | PREFILLED_SYRINGE | INTRAVENOUS | Status: DC | PRN

## 2019-04-09 MED ORDER — LACTATED RINGERS IV SOLN
500.0000 mL | INTRAVENOUS | Status: DC | PRN
  Administered 2019-04-09: 1000 mL via INTRAVENOUS

## 2019-04-09 MED ORDER — OXYTOCIN 40 UNITS IN NORMAL SALINE INFUSION - SIMPLE MED
1.0000 m[IU]/min | INTRAVENOUS | Status: DC
  Administered 2019-04-09: 1 m[IU]/min via INTRAVENOUS
  Administered 2019-04-09: 8 m[IU]/min via INTRAVENOUS
  Filled 2019-04-09: qty 1000

## 2019-04-09 MED ORDER — FENTANYL-BUPIVACAINE-NACL 0.5-0.125-0.9 MG/250ML-% EP SOLN: 12.0000 mL/h | EPIDURAL | Status: DC | PRN

## 2019-04-09 MED ORDER — TERBUTALINE SULFATE 1 MG/ML IJ SOLN: 0.2500 mg | Freq: Once | INTRAMUSCULAR | Status: DC | PRN

## 2019-04-09 MED ORDER — EPHEDRINE 5 MG/ML INJ: 10.0000 mg | INTRAVENOUS | Status: DC | PRN

## 2019-04-09 MED ORDER — FENTANYL CITRATE (PF) 100 MCG/2ML IJ SOLN: 100.0000 ug | INTRAMUSCULAR | Status: DC | PRN

## 2019-04-09 MED ORDER — MISOPROSTOL 25 MCG QUARTER TABLET
25.0000 ug | ORAL_TABLET | ORAL | Status: DC | PRN
  Administered 2019-04-09 (×3): 25 ug via VAGINAL
  Filled 2019-04-09 (×3): qty 1

## 2019-04-09 MED ORDER — ONDANSETRON HCL 4 MG/2ML IJ SOLN: 4.0000 mg | Freq: Four times a day (QID) | INTRAMUSCULAR | Status: DC | PRN

## 2019-04-09 MED ORDER — LIDOCAINE HCL (PF) 1 % IJ SOLN: 30.0000 mL | INTRAMUSCULAR | Status: DC | PRN

## 2019-04-09 MED ORDER — LACTATED RINGERS IV SOLN
INTRAVENOUS | Status: DC
  Administered 2019-04-09 (×3): via INTRAVENOUS

## 2019-04-09 MED ORDER — ACETAMINOPHEN 325 MG PO TABS: 650.0000 mg | ORAL_TABLET | ORAL | Status: DC | PRN

## 2019-04-09 MED ORDER — DIPHENHYDRAMINE HCL 50 MG/ML IJ SOLN: 12.5000 mg | INTRAMUSCULAR | Status: DC | PRN

## 2019-04-09 MED ORDER — OXYTOCIN 40 UNITS IN NORMAL SALINE INFUSION - SIMPLE MED
2.5000 [IU]/h | INTRAVENOUS | Status: DC
  Administered 2019-04-10: 2.5 [IU]/h via INTRAVENOUS

## 2019-04-09 MED ORDER — OXYTOCIN 40 UNITS IN NORMAL SALINE INFUSION - SIMPLE MED: 1.0000 m[IU]/min | INTRAVENOUS | Status: DC

## 2019-04-09 MED ORDER — LACTATED RINGERS IV SOLN: 500.0000 mL | Freq: Once | INTRAVENOUS | Status: DC

## 2019-04-09 MED ORDER — OXYTOCIN BOLUS FROM INFUSION: 500.0000 mL | Freq: Once | INTRAVENOUS | Status: DC

## 2019-04-09 MED ORDER — FENTANYL CITRATE (PF) 100 MCG/2ML IJ SOLN
50.0000 ug | INTRAMUSCULAR | Status: DC | PRN
  Administered 2019-04-09 – 2019-04-10 (×2): 100 ug via INTRAVENOUS
  Filled 2019-04-09 (×2): qty 2

## 2019-04-09 NOTE — Progress Notes (Signed)
Teresa Velez is a 28 y.o. G1P0000 at [redacted]w[redacted]d admitted for induction of labor for IUGR  Subjective: Patient comfortable, not feeling any contractions.   Objective: BP (!) 106/54   Pulse 90   Temp 98.7 F (37.1 C) (Oral)   Resp 16   Ht 5\' 3"  (1.6 m)   Wt 78.5 kg   LMP 07/24/2018 (Approximate)   BMI 30.65 kg/m  No intake/output data recorded. No intake/output data recorded.  FHT:  FHR: 150 bpm, variability: moderate,  accelerations:  Present,  decelerations:  Absent UC:   none SVE:   Dilation: 1.5 Effacement (%): 20 Station: -3 Exam by: Dr. Alesia Richards.   Foley balloon placed into cervix, 50cc NS instilled at 1350.    Labs: Lab Results  Component Value Date   WBC 8.0 04/09/2019   HGB 11.7 (L) 04/09/2019   HCT 35.1 (L) 04/09/2019   MCV 83.8 04/09/2019   PLT 293 04/09/2019    Assessment / Plan: Induction of labor due to IUGR   Labor: s/p Cytotec X 3 doses, foley ballool catheter placed in, plan to start lpitocin titration after 2pm, to 6 mu/min unitl foley falls out.    Fetal Wellbeing:  Category I Pain Control:  Labor support without medications, Epidural and IV pain meds prn I/D:  GBS Negative.  Anticipated MOD:  NSVD  Unknown [facility] 04/09/2019, 12:50 PM

## 2019-04-10 ENCOUNTER — Encounter (HOSPITAL_COMMUNITY): Payer: Self-pay

## 2019-04-10 DIAGNOSIS — B009 Herpesviral infection, unspecified: Secondary | ICD-10-CM

## 2019-04-10 LAB — CBC
HCT: 34.7 % — ABNORMAL LOW (ref 36.0–46.0)
Hemoglobin: 11.4 g/dL — ABNORMAL LOW (ref 12.0–15.0)
MCH: 28.3 pg (ref 26.0–34.0)
MCHC: 32.9 g/dL (ref 30.0–36.0)
MCV: 86.1 fL (ref 80.0–100.0)
Platelets: 265 10*3/uL (ref 150–400)
RBC: 4.03 MIL/uL (ref 3.87–5.11)
RDW: 14.2 % (ref 11.5–15.5)
WBC: 14 10*3/uL — ABNORMAL HIGH (ref 4.0–10.5)
nRBC: 0 % (ref 0.0–0.2)

## 2019-04-10 MED ORDER — WITCH HAZEL-GLYCERIN EX PADS: 1.0000 "application " | MEDICATED_PAD | CUTANEOUS | Status: DC | PRN

## 2019-04-10 MED ORDER — IBUPROFEN 600 MG PO TABS
600.0000 mg | ORAL_TABLET | Freq: Four times a day (QID) | ORAL | Status: DC
  Administered 2019-04-10 – 2019-04-12 (×10): 600 mg via ORAL
  Filled 2019-04-10 (×10): qty 1

## 2019-04-10 MED ORDER — SENNOSIDES-DOCUSATE SODIUM 8.6-50 MG PO TABS
2.0000 | ORAL_TABLET | ORAL | Status: DC
  Administered 2019-04-11 – 2019-04-12 (×2): 2 via ORAL
  Filled 2019-04-10 (×2): qty 2

## 2019-04-10 MED ORDER — TETANUS-DIPHTH-ACELL PERTUSSIS 5-2.5-18.5 LF-MCG/0.5 IM SUSP: 0.5000 mL | Freq: Once | INTRAMUSCULAR | Status: DC

## 2019-04-10 MED ORDER — SIMETHICONE 80 MG PO CHEW: 80.0000 mg | CHEWABLE_TABLET | ORAL | Status: DC | PRN

## 2019-04-10 MED ORDER — COCONUT OIL OIL: 1.0000 "application " | TOPICAL_OIL | Status: DC | PRN

## 2019-04-10 MED ORDER — DIBUCAINE (PERIANAL) 1 % EX OINT: 1.0000 "application " | TOPICAL_OINTMENT | CUTANEOUS | Status: DC | PRN

## 2019-04-10 MED ORDER — ONDANSETRON HCL 4 MG/2ML IJ SOLN: 4.0000 mg | INTRAMUSCULAR | Status: DC | PRN

## 2019-04-10 MED ORDER — PRENATAL MULTIVITAMIN CH
1.0000 | ORAL_TABLET | Freq: Every day | ORAL | Status: DC
  Administered 2019-04-10 – 2019-04-12 (×3): 1 via ORAL
  Filled 2019-04-10 (×3): qty 1

## 2019-04-10 MED ORDER — DIPHENHYDRAMINE HCL 25 MG PO CAPS: 25.0000 mg | ORAL_CAPSULE | Freq: Four times a day (QID) | ORAL | Status: DC | PRN

## 2019-04-10 MED ORDER — ONDANSETRON HCL 4 MG PO TABS: 4.0000 mg | ORAL_TABLET | ORAL | Status: DC | PRN

## 2019-04-10 MED ORDER — BENZOCAINE-MENTHOL 20-0.5 % EX AERO
1.0000 "application " | INHALATION_SPRAY | CUTANEOUS | Status: DC | PRN
  Administered 2019-04-10: 1 via TOPICAL
  Filled 2019-04-10: qty 56

## 2019-04-10 MED ORDER — ACETAMINOPHEN 325 MG PO TABS
650.0000 mg | ORAL_TABLET | ORAL | Status: DC | PRN
  Administered 2019-04-10 – 2019-04-11 (×4): 650 mg via ORAL
  Filled 2019-04-10 (×4): qty 2

## 2019-04-10 NOTE — Lactation Note (Addendum)
This note was copied from a baby's chart. Lactation Consultation Note  Patient Name: Teresa Velez [id number] Date: 04/10/2019 Reason for consult: Initial assessment;1st time breastfeeding;Early term 37-38.6wks P1, 2 hour female infant, IUGR. LC discussed with mom possible supplementation of donor milk due to infant small size. Tools given: breast shells, hand pump, breast shells and 20 mm NS due to mom having very flat nipples. Per mom, she recieves WIC in [facility] and she doesn't have breast pump at home. [place] referral sent to [facility] for [place]. Per mom, infant did not latch in L&D. LC entered room mom was doing STS with infant. LC ask mom pre-pump prior to latching infant with 20 mm NS,  Mom latched infant on left breast, infant latched with wide mouth, top lip flanged and nose and chin touching breast. Infant was still breastfeeding after 15 minutes when LC left the room. Mom knows to breastfeed infant according to hunger cues, 8 to 12 times within 24 hours and on demand. Mom knows to call RN or LC if she has any questions, concerns or need further assistance with latching infant to breast. Mom shown how to use DEBP & how to disassemble, clean, & reassemble parts by RN. Mom understands to use DEBP after latching infant at breast, pump every 3 hours both breast for 15 minutes on initial setting. Mom understands to wear breast shells in bra during the day and not sleep in them at night.  Mom made aware of O/P services, breastfeeding support groups, community resources, and our phone # for post-discharge questions.   Maternal Data Formula Feeding for Exclusion: No Has patient been taught Hand Expression?: Yes(Colostrum present both breast.) Does the patient have breastfeeding experience prior to this delivery?: No  Feeding Feeding Type: Breast Fed  LATCH Score Latch: Grasps breast easily, tongue down, lips flanged, rhythmical sucking.  Audible Swallowing:  Spontaneous and intermittent  Type of Nipple: Flat  Comfort (Breast/Nipple): Soft / non-tender  Hold (Positioning): Assistance needed to correctly position infant at breast and maintain latch.  LATCH Score: 8  Interventions Interventions: Breast feeding basics reviewed;Breast compression;Adjust position;Hand pump;Pre-pump if needed;Shells;Hand express;Breast massage;Position options;Support pillows;Skin to skin;Assisted with latch  Lactation Tools Discussed/Used Tools: Shells;Pump Shell Type: Other (comment)(very flat nipples) Breast pump type: Double-Electric Breast Pump;Manual WIC Program: Yes Pump Review: Setup, frequency, and cleaning;Milk Storage Initiated by:: byRN   Consult Status Consult Status: Follow-up Date: 04/10/19 Follow-up type: In-patient    Vicente Serene 04/10/2019, 4:46 AM

## 2019-04-10 NOTE — Progress Notes (Signed)
Subjective:    Cervical balloon spont expelled @ 2000 per RN. Desires labor w/o epidural. IV pain meds offer moderate relief. Breathing and position changes discussed.   Objective:    VS: BP 112/74   Pulse 83   Temp 98.1 F (36.7 C) (Oral)   Resp 16   Ht 5\' 3"  (1.6 m)   Wt 78.5 kg   LMP 07/24/2018 (Approximate)   BMI 30.65 kg/m  FHR : baseline 155 / variability moderate / accelerations present / absent decelerations Toco: contractions every 3 minutes Membranes: AROM @ 0024, clear fluid Dilation: 6 Effacement (%): 80 Cervical Position: Middle Station: 0 Presentation: Vertex Exam by:: Clois Dupes, CNM Pitocin 30mU/min  Assessment/Plan:   28 y.o. G1P0000 [redacted]w[redacted]d IOL for IUGR  Labor: S/P Cytotec x3, foley balloon, now Progressing on Pitocin Fetal Wellbeing:  Category I Pain Control:  IV pain meds I/D:  GBS neg, HSV on suppressive therapy Anticipated MOD:  NSVD  Arrie Eastern CNM, MSN 04/10/2019 12:31 AM

## 2019-04-11 LAB — SYPHILIS: RPR W/REFLEX TO RPR TITER AND TREPONEMAL ANTIBODIES, TRADITIONAL SCREENING AND DIAGNOSIS ALGORITHM: RPR Ser Ql: NONREACTIVE

## 2019-04-11 LAB — SURGICAL PATHOLOGY

## 2019-04-11 MED ORDER — INFLUENZA VAC SPLIT QUAD 0.5 ML IM SUSY
0.5000 mL | PREFILLED_SYRINGE | INTRAMUSCULAR | Status: AC
  Administered 2019-04-12: 0.5 mL via INTRAMUSCULAR
  Filled 2019-04-11: qty 0.5

## 2019-04-11 MED ORDER — IBUPROFEN 600 MG PO TABS: 600.0000 mg | ORAL_TABLET | Freq: Four times a day (QID) | ORAL | 0 refills | Status: DC

## 2019-04-11 NOTE — Lactation Note (Addendum)
This note was copied from a baby's chart. Lactation Consultation Note  Patient Name: Girl Mayling Aber [id number] Date: 04/11/2019   Baby 88 hours old and she has not stooled.  [redacted]w[redacted]d.  < 6 lbs. Mother states it is her choice to formula feed but would like to attempt bf and pump. Mother has not pumped today.  Encouraged her to pump q 3 hours and call for latch assistance. Mother did not want to wake baby due to potential hearing screen.  Suggest mother call to assistance.   Returned to room to assist mother with latching. Attempted with and without NS and baby sustained a sleepy latch for approx 10 min with #20NS.  Prefilled NS with formula. Mother prefers football hold.  Mother states she feels cramping when baby latches. Provided education. Provided LPI information sheet and suggest increasing volume. Mother plans to call [facility] today to discuss getting a DEBP. Feed on demand with cues at least q 3 hours.  Goal 8-12+ times per day after first 24 hrs.  Place baby STS if not cueing.       Maternal Data    Feeding Feeding Type: Bottle Fed - Formula Nipple Type: Nfant Slow Flow (purple)  LATCH Score                   Interventions    Lactation Tools Discussed/Used     Consult Status      Vivianne Master Boschen 04/11/2019, 10:00 AM

## 2019-04-11 NOTE — Discharge Summary (Addendum)
Discharge delayed D/T infant status. Discharge summary updated.   OB Discharge Summary     Patient Name: Teresa Velez DOB: [birth year] MRN: [id number]  Date of admission: 04/09/2019 Delivering MD: Rhea Pink B   Date of discharge: 04/11/2019  Admitting diagnosis: Pregnancy Intrauterine pregnancy: [redacted]w[redacted]d     Secondary diagnosis:  Active Problems:   SVD (12/8)   IUGR (intrauterine growth retardation) affecting mother, third trimester, fetus 1   IUGR (intrauterine growth restriction) affecting care of mother   HSV-2 infection  Additional problems: none     Discharge diagnosis: Term Pregnancy Delivered and HSV-2                                                                                                Post partum procedures:none  Augmentation: Pitocin and Cytotec  Complications: None  Hospital course:  Induction of Labor With Vaginal Delivery   28 y.o. yo G1P1001 at [redacted]w[redacted]d was admitted to the hospital 04/09/2019 for induction of labor.  Indication for induction: IUGR.  Patient had an uncomplicated labor course as follows: Membrane Rupture Time/Date: 12:24 AM ,04/10/2019   Intrapartum Procedures: Episiotomy: None [1]                                         Lacerations:  None [1]  Patient had delivery of a Viable infant.  Information for the patient's newborn:  [place], Scardino [[id number]]  Delivery Method: Vaginal, Spontaneous(Filed from Delivery Summary)    04/10/2019  Details of delivery can be found in separate delivery note.  Patient had a routine postpartum course. Patient is discharged home 04/11/19.  Physical exam  Vitals:   04/10/19 1412 04/10/19 1753 04/10/19 2123 04/11/19 0525  BP: 110/61 121/68 120/75 119/67  Pulse: 81 88 89 81  Resp: 18 18 18 18   Temp: 99.1 F (37.3 C) 98.8 F (37.1 C) 97.8 F (36.6 C) 98.1 F (36.7 C)  TempSrc: Oral Oral Oral Oral  SpO2: 100% 100% 100% 100%  Weight:      Height:       General: alert, cooperative and no  distress Lochia: appropriate Uterine Fundus: firm Incision: N/A DVT Evaluation: No evidence of DVT seen on physical exam. No cords or calf tenderness. No significant calf/ankle edema. Labs: Lab Results  Component Value Date   WBC 14.0 (H) 04/10/2019   HGB 11.4 (L) 04/10/2019   HCT 34.7 (L) 04/10/2019   MCV 86.1 04/10/2019   PLT 265 04/10/2019   CMP Latest Ref Rng & Units 08/25/2015  Glucose 65 - 99 mg/dL 08/27/2015)  BUN 6 - 20 mg/dL 10  Creatinine 353(G - 9.92 mg/dL 4.26  Sodium 8.34 - 196 mmol/L 139  Potassium 3.5 - 5.1 mmol/L 3.1(L)  Chloride 101 - 111 mmol/L 105  CO2 22 - 32 mmol/L 23  Calcium 8.9 - 10.3 mg/dL 9.2    Discharge instruction: per After Visit Summary and "Baby and Me Booklet".  After visit meds:  Allergies as of 04/11/2019  Reactions   Amoxicillin Hives   Penicillins Hives   Did it involve swelling of the face/tongue/throat, SOB, or low BP? no Did it involve sudden or severe rash/hives, skin peeling, or any reaction on the inside of your mouth or nose? yes Did you need to seek medical attention at a hospital or doctor's office? Yes When did it last happen? If all above answers are "NO", may proceed with cephalosporin use.   Flagyl [metronidazole] Itching, Rash      Medication List    STOP taking these medications   VALTREX PO     TAKE these medications   ibuprofen 600 MG tablet Commonly known as: ADVIL Take 1 tablet (600 mg total) by mouth every 6 (six) hours.   Vitafol Gummies 3.33-0.333-34.8 MG Chew Chew 3 tablets by mouth daily.       Diet: routine diet  Activity: Advance as tolerated. Pelvic rest for 6 weeks.   Outpatient follow up:6 weeks Follow up Appt:No future appointments. Follow up Visit:No follow-ups on file.  Postpartum contraception: Undecided  Newborn Data: Live born female  Birth Weight: 5 lb 4.1 oz (2384 g) APGAR: 9, 9  Newborn Delivery   Birth date/time: 04/10/2019 02:02:00 Delivery type: Vaginal,  Spontaneous      Baby Feeding: Bottle and Breast Disposition:home with mother   04/12/2019 Arrie Eastern, CNM

## 2019-04-12 MED ORDER — TETANUS-DIPHTH-ACELL PERTUSSIS 5-2.5-18.5 LF-MCG/0.5 IM SUSP
0.5000 mL | Freq: Once | INTRAMUSCULAR | Status: AC
  Administered 2019-04-12: 0.5 mL via INTRAMUSCULAR
  Filled 2019-04-12: qty 0.5

## 2019-04-12 NOTE — Lactation Note (Signed)
This note was copied from a baby's chart. Lactation Consultation Note  Patient Name: Teresa Velez [id number] Date: 04/12/2019 Reason for consult: Follow-up assessment;Infant < 6lbs;Early term 37-38.6wks;Primapara Baby is 55 hours old/1% weight loss.  Baby is formula feeding with Dr. Saul Fordyce bottle.  Voids WNL but no stool since birth.  Mom is not pumping consistently.  She states she pumped this morning but did not obtain any milk.  Reassured and discussed milk coming to volume and the prevention and treatment of engorgement.  Stressed importance of pumping every 3 hours to establish and maintain her milk supply.  She plans on obtaining a pump from [facility].  No questions at this time.  Reviewed outpatient services and encouraged to call prn.  Maternal Data    Feeding    LATCH Score                   Interventions    Lactation Tools Discussed/Used     Consult Status Consult Status: Complete Follow-up type: Call as needed    Ave Filter 04/12/2019, 9:17 AM

## 2019-04-13 ENCOUNTER — Ambulatory Visit: Payer: Self-pay

## 2019-04-13 NOTE — Lactation Note (Signed)
This note was copied from a baby's chart. Lactation Consultation Note  Patient Name: Teresa Velez [id number] Date: 04/13/2019 Reason for consult: Follow-up assessment;Infant < 6lbs;Early term 37-38.6wks;1st time breastfeeding;Primapara  LC in to visit with P32 Mom of ET infant on day of discharge.  Baby is 21 hrs old and at 1% weight loss.  Baby is being supplemented regularly with 22 cal formula.  Breasts are filling.  Engorgement prevention and treatment discussed.  Mom hasn't been pumping as regularly as she should.  Mom feels she will be more organized at home and wants to breastfeed. Mom plans to pick up pump from [facility] before 3pm.  Mom aware of OP lactation support for her.  Request made to OP lactation clinic for follow-up.    Reassured Mom that baby would be able to breastfeed with growth and maturity.  Mom's responsibility is to support a full milk supply by trying to double pump >8 times per 24 hrs.  Mom in agreement.   Mom knows to call prn for concerns.   Interventions Interventions: Breast feeding basics reviewed;Hand express;Breast massage;Skin to skin;DEBP  Lactation Tools Discussed/Used Tools: Pump Breast pump type: Double-Electric Breast Pump   Consult Status Consult Status: Complete Date: 04/13/19 Follow-up type: [place] 04/13/2019, 12:30 PM

## 2020-01-08 ENCOUNTER — Ambulatory Visit (HOSPITAL_COMMUNITY)
Admission: EM | Admit: 2020-01-08 | Discharge: 2020-01-08 | Disposition: A | Discharge status: Home / Self Care | Payer: Medicaid Other | Attending: Physician Assistant | Admitting: Physician Assistant

## 2020-01-08 ENCOUNTER — Other Ambulatory Visit: Payer: Self-pay

## 2020-01-08 ENCOUNTER — Encounter (HOSPITAL_COMMUNITY): Payer: Self-pay

## 2020-01-08 DIAGNOSIS — Z20822 Contact with and (suspected) exposure to covid-19: Secondary | ICD-10-CM | POA: Insufficient documentation

## 2020-01-08 DIAGNOSIS — Z880 Allergy status to penicillin: Secondary | ICD-10-CM | POA: Insufficient documentation

## 2020-01-08 DIAGNOSIS — Z791 Long term (current) use of non-steroidal anti-inflammatories (NSAID): Secondary | ICD-10-CM | POA: Insufficient documentation

## 2020-01-08 DIAGNOSIS — Z881 Allergy status to other antibiotic agents status: Secondary | ICD-10-CM | POA: Insufficient documentation

## 2020-01-08 DIAGNOSIS — Z79899 Other long term (current) drug therapy: Secondary | ICD-10-CM | POA: Insufficient documentation

## 2020-01-08 DIAGNOSIS — J029 Acute pharyngitis, unspecified: Secondary | ICD-10-CM | POA: Diagnosis not present

## 2020-01-08 LAB — POCT RAPID STREP A, ED / UC: Streptococcus, Group A Screen (Direct): NEGATIVE

## 2020-01-08 MED ORDER — CEPACOL SORE THROAT 5.4 MG MT LOZG: 1.0000 | LOZENGE | OROMUCOSAL | 0 refills | Status: DC | PRN

## 2020-01-08 MED ORDER — ACETAMINOPHEN 500 MG PO TABS: 1000.0000 mg | ORAL_TABLET | Freq: Three times a day (TID) | ORAL | 0 refills | Status: DC | PRN

## 2020-01-08 MED ORDER — IBUPROFEN 600 MG PO TABS: 600.0000 mg | ORAL_TABLET | Freq: Four times a day (QID) | ORAL | 0 refills | Status: DC | PRN

## 2020-01-08 NOTE — ED Triage Notes (Signed)
Pt is here with a sore throat that started 3 days ago, pt has taken Tylenol to relieve discomfort.

## 2020-01-08 NOTE — Discharge Instructions (Signed)
Your strep test was negative, we have sent a culture  Take the Tylenol and ibuprofen as prescribed Use the Cepacol lozenges up to every 2 hours  If not significant improving over the next several days return or follow-up with your primary care  If your Covid-19 test is positive, you will receive a phone call from [facility] regarding your results. Negative test results are not called. Both positive and negative results area always visible on MyChart. If you do not have a MyChart account, sign up instructions are in your discharge papers.   Persons who are directed to care for themselves at home may discontinue isolation under the following conditions:   At least 10 days have passed since symptom onset and  At least 24 hours have passed without running a fever (this means without the use of fever-reducing medications) and  Other symptoms have improved.  Persons infected with COVID-19 who never develop symptoms may discontinue isolation and other precautions 10 days after the date of their first positive COVID-19 test.

## 2020-01-08 NOTE — ED Provider Notes (Signed)
[facility]    CSN: [id number] Arrival date & time: 01/08/20  1554      History   Chief Complaint Chief Complaint  Patient presents with  . Sore Throat    HPI Teresa Velez is a 29 y.o. female.   Patient reports for sore throat.  Symptoms started 3 days ago.  She reports pain with swallowing.  Some mild relief with Tylenol.  Has been to eat and drink.  Denies fever, chills.  Denies runny nose, cough, abdominal pain.  No nausea or vomiting.  No headache.  No change in taste or smell.  No known sick contacts outside of her 32-month-old who recently had RSV last month.     Past Medical History:  Diagnosis Date  . Migraines   . STD (sexually transmitted disease)    chlamydia treated 2011, HSV 2  . Trichomonal vaginitis 11/01/14  . Vaginal Pap smear, abnormal     Patient Active Problem List   Diagnosis Date Noted  . 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 IUGR (intrauterine growth restriction) affecting care of mother 04/09/2019  . IUGR (intrauterine growth retardation) affecting mother, third trimester, fetus 1 04/08/2019    Past Surgical History:  Procedure Laterality Date  . COLPOSCOPY  5/09   CINI    OB History    Gravida  1   Para  1   Term  1   Preterm  0   AB  0   Living  1     SAB  0   TAB  0   Ectopic  0   Multiple  0   Live Births  1            Home Medications    Prior to Admission medications   Medication Sig Start Date End Date Taking? Authorizing Provider  acetaminophen (TYLENOL) 500 MG tablet Take 2 tablets (1,000 mg total) by mouth every 8 (eight) hours as needed. 01/08/20   Rheanna Sergent, Veryl Speak, PA-C  ibuprofen (ADVIL) 600 MG tablet Take 1 tablet (600 mg total) by mouth every 6 (six) hours as needed. 01/08/20   Reyce Lubeck, Veryl Speak, PA-C  Menthol (CEPACOL SORE THROAT) 5.4 MG LOZG Use as directed 1 lozenge (5.4 mg total) in the mouth or throat every 2 (two) hours as needed. 01/08/20   Virginie Josten, Veryl Speak, PA-C  Prenatal  Vit-Fe Phos-FA-Omega (VITAFOL GUMMIES) 3.33-0.333-34.8 MG CHEW Chew 3 tablets by mouth daily. 03/16/19   [provider]    Family History Family History  Problem Relation Age of Onset  . Hypertension Maternal Grandmother   . Healthy Mother   . Healthy Father     Social History Social History   Tobacco Use  . Smoking status: Never Smoker  . Smokeless tobacco: Never Used  Vaping Use  . Vaping Use: Never used  Substance Use Topics  . Alcohol use: Not Currently  . Drug use: Not Currently    Types: Marijuana     Allergies   Amoxicillin, Penicillins, and Flagyl [metronidazole]   Review of Systems Review of Systems   Physical Exam Triage Vital Signs ED Triage Vitals  Enc Vitals Group     BP      Pulse      Resp      Temp      Temp src      SpO2      Weight      Height      Head Circumference  Peak Flow      Pain Score      Pain Loc      Pain Edu?      Excl. in GC?    No data found.  Updated Vital Signs BP 116/69 (BP Location: Right Arm)   Pulse 79   Temp 98.5 F (36.9 C) (Oral)   Resp 18   LMP 12/31/2019   SpO2 100%   Breastfeeding No   Visual Acuity Right Eye Distance:   Left Eye Distance:   Bilateral Distance:    Right Eye Near:   Left Eye Near:    Bilateral Near:     Physical Exam Vitals and nursing note reviewed.  Constitutional:      General: She is not in acute distress.    Appearance: She is well-developed. She is not ill-appearing.  HENT:     Head: Normocephalic and atraumatic.     Right Ear: Tympanic membrane normal.     Left Ear: Tympanic membrane normal.     Mouth/Throat:     Mouth: Mucous membranes are moist.     Pharynx: Oropharynx is clear. Uvula midline. No oropharyngeal exudate, posterior oropharyngeal erythema or uvula swelling.     Tonsils: No tonsillar exudate or tonsillar abscesses. 0 on the right. 0 on the left.  Eyes:     Conjunctiva/sclera: Conjunctivae normal.  Cardiovascular:     Rate and Rhythm:  Normal rate and regular rhythm.     Heart sounds: No murmur heard.   Pulmonary:     Effort: Pulmonary effort is normal. No respiratory distress.     Breath sounds: Normal breath sounds. No wheezing, rhonchi or rales.  Abdominal:     Palpations: Abdomen is soft.     Tenderness: There is no abdominal tenderness.  Musculoskeletal:     Cervical back: Neck supple.  Skin:    General: Skin is warm and dry.  Neurological:     Mental Status: She is alert.      UC Treatments / Results  Labs (all labs ordered are listed, but only abnormal results are displayed) Labs Reviewed  SARS CORONAVIRUS 2 (TAT 6-24 HRS)  CULTURE, GROUP A STREP [facility])  POCT RAPID STREP A, ED / UC    EKG   Radiology No results found.  Procedures Procedures (including critical care time)  Medications Ordered in UC Medications - No data to display  Initial Impression / Assessment and Plan / UC Course  I have reviewed the triage vital signs and the nursing notes.  Pertinent labs & imaging results that were available during my care of the patient were reviewed by me and considered in my medical decision making (see chart for details).     #Viral pharyngitis Patient 29 year old presenting with viral pharyngitis.  Rapid strep negative.  Culture sent.  Covid sent.  Normal vital signs and reassuring exam we will treat symptomatically.  Discussed return follow-up and emergency part precautions.  Patient verbalized understand plan of care Final Clinical Impressions(s) / UC Diagnoses   Final diagnoses:  Viral pharyngitis     Discharge Instructions     Your strep test was negative, we have sent a culture  Take the Tylenol and ibuprofen as prescribed Use the Cepacol lozenges up to every 2 hours  If not significant improving over the next several days return or follow-up with your primary care  If your Covid-19 test is positive, you will receive a phone call from [facility] regarding your results.  Negative test results  are not called. Both positive and negative results area always visible on MyChart. If you do not have a MyChart account, sign up instructions are in your discharge papers.   Persons who are directed to care for themselves at home may discontinue isolation under the following conditions:  . At least 10 days have passed since symptom onset and . At least 24 hours have passed without running a fever (this means without the use of fever-reducing medications) and . Other symptoms have improved.  Persons infected with COVID-19 who never develop symptoms may discontinue isolation and other precautions 10 days after the date of their first positive COVID-19 test.       ED Prescriptions    Medication Sig Dispense Auth. Provider   ibuprofen (ADVIL) 600 MG tablet Take 1 tablet (600 mg total) by mouth every 6 (six) hours as needed. 30 tablet Divante Kotch, Veryl Speak, PA-C   acetaminophen (TYLENOL) 500 MG tablet Take 2 tablets (1,000 mg total) by mouth every 8 (eight) hours as needed. 30 tablet Penney Domanski, Veryl Speak, PA-C   Menthol (CEPACOL SORE THROAT) 5.4 MG LOZG Use as directed 1 lozenge (5.4 mg total) in the mouth or throat every 2 (two) hours as needed. 30 lozenge Maly Lemarr, Veryl Speak, PA-C     PDMP not reviewed this encounter.   Hermelinda Medicus, PA-C 01/08/20 1942

## 2020-01-09 LAB — SARS CORONAVIRUS 2 (TAT 6-24 HRS): SARS Coronavirus 2: NEGATIVE

## 2020-01-11 LAB — CULTURE, GROUP A STREP (THRC)

## 2020-02-06 ENCOUNTER — Emergency Department (INDEPENDENT_AMBULATORY_CARE_PROVIDER_SITE_OTHER)
Admission: RE | Admit: 2020-02-06 | Discharge: 2020-02-06 | Disposition: A | Discharge status: Home / Self Care | Payer: Medicaid Other | Source: Ambulatory Visit

## 2020-02-06 ENCOUNTER — Telehealth: Payer: Self-pay

## 2020-02-06 ENCOUNTER — Other Ambulatory Visit (HOSPITAL_COMMUNITY)
Admission: RE | Admit: 2020-02-06 | Discharge: 2020-02-06 | Disposition: A | Discharge status: Home / Self Care | Payer: Medicaid Other | Source: Ambulatory Visit | Attending: Family Medicine | Admitting: Family Medicine

## 2020-02-06 ENCOUNTER — Other Ambulatory Visit: Payer: Self-pay

## 2020-02-06 VITALS — BP 135/86 | HR 85 | Temp 98.9°F | Resp 17

## 2020-02-06 DIAGNOSIS — Z7689 Persons encountering health services in other specified circumstances: Secondary | ICD-10-CM

## 2020-02-06 LAB — POCT URINALYSIS DIP (MANUAL ENTRY)
Bilirubin, UA: NEGATIVE
Glucose, UA: NEGATIVE mg/dL
Ketones, POC UA: NEGATIVE mg/dL
Leukocytes, UA: NEGATIVE
Nitrite, UA: NEGATIVE
Protein Ur, POC: NEGATIVE mg/dL
Spec Grav, UA: 1.02 (ref 1.010–1.025)
Urobilinogen, UA: 0.2 E.U./dL
pH, UA: 5.5 (ref 5.0–8.0)

## 2020-02-06 LAB — POCT URINE PREGNANCY: Preg Test, Ur: NEGATIVE

## 2020-02-06 MED ORDER — DOXYCYCLINE HYCLATE 100 MG PO CAPS: 100.0000 mg | ORAL_CAPSULE | Freq: Two times a day (BID) | ORAL | 0 refills | Status: AC

## 2020-02-06 MED ORDER — DOXYCYCLINE HYCLATE 100 MG PO CAPS: 100.0000 mg | ORAL_CAPSULE | Freq: Two times a day (BID) | ORAL | 0 refills | Status: DC

## 2020-02-06 MED ORDER — CEFTRIAXONE SODIUM 500 MG IJ SOLR
500.0000 mg | Freq: Once | INTRAMUSCULAR | Status: AC
  Administered 2020-02-06: 500 mg via INTRAMUSCULAR

## 2020-02-06 NOTE — Telephone Encounter (Signed)
Medication re-called into pharmacy of pt's choice. Was not available at pharmacy when she checked just now.

## 2020-02-06 NOTE — Telephone Encounter (Signed)
Patient called asking if she was tested for a UTI during her visit just now. Pt was asked during her visit if she had urinary sx or concerns and she denied at that time. Urinalysis performed via phone note and culture sent per Selena Batten, FNP. Will call pt if abnormal culture results. Pt's questions answered and she verbalized understanding.

## 2020-02-06 NOTE — ED Notes (Signed)
Pt states she feels fine after Rocephin injection given over 15 minutes ago (was instructed to have pt wait 15 minutes following injection to make sure there was no reaction). Instructed to call 911 if she develops sob or throat closing, problems breathing. Pt verbalizes understanding.

## 2020-02-06 NOTE — ED Provider Notes (Signed)
[facility]    CSN: [id number] Arrival date & time: 02/06/20  1544      History   Chief Complaint Chief Complaint  Patient presents with  . Testing    HPI Teresa Velez is a 29 y.o. female.   HPI  Patient presents for concern of STD exposure. Reports a partner notified her today that he was having penile discharge and recently tested for STD and received treatment. She was unaware of what exactly the partner was treated for. Patient has a STD history of Chlamydia, HSV, and trichomoniasis. Patient has a allergy to  metronidazole and reports provider at the time was unable to to treat her successfully. Patient is from [place] therefore no acces to records. Patient endorses no symptoms of vaginal odor, changes to discharge, dysuria, nausea or vomiting. Patient's last menstrual period was 02/05/2020. Past Medical History:  Diagnosis Date  . Migraines   . STD (sexually transmitted disease)    chlamydia treated 2011, HSV 2  . Trichomonal vaginitis 11/01/14  . Vaginal Pap smear, abnormal     Patient Active Problem List   Diagnosis Date Noted  . 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 IUGR (intrauterine growth restriction) affecting care of mother 04/09/2019  . IUGR (intrauterine growth retardation) affecting mother, third trimester, fetus 1 04/08/2019    Past Surgical History:  Procedure Laterality Date  . COLPOSCOPY  5/09   CINI    OB History    Gravida  1   Para  1   Term  1   Preterm  0   AB  0   Living  1     SAB  0   TAB  0   Ectopic  0   Multiple  0   Live Births  1            Home Medications    Prior to Admission medications   Medication Sig Start Date End Date Taking? Authorizing Provider  acetaminophen (TYLENOL) 500 MG tablet Take 2 tablets (1,000 mg total) by mouth every 8 (eight) hours as needed. 01/08/20   Darr, Veryl Speak, PA-C  ibuprofen (ADVIL) 600 MG tablet Take 1 tablet (600 mg total) by mouth every 6 (six)  hours as needed. 01/08/20   Darr, Veryl Speak, PA-C  Menthol (CEPACOL SORE THROAT) 5.4 MG LOZG Use as directed 1 lozenge (5.4 mg total) in the mouth or throat every 2 (two) hours as needed. 01/08/20   Darr, Veryl Speak, PA-C  Prenatal Vit-Fe Phos-FA-Omega (VITAFOL GUMMIES) 3.33-0.333-34.8 MG CHEW Chew 3 tablets by mouth daily. 03/16/19   [provider]    Family History Family History  Problem Relation Age of Onset  . Hypertension Maternal Grandmother   . Healthy Mother   . Healthy Father     Social History Social History   Tobacco Use  . Smoking status: Never Smoker  . Smokeless tobacco: Never Used  Vaping Use  . Vaping Use: Never used  Substance Use Topics  . Alcohol use: Not Currently  . Drug use: Not Currently    Types: Marijuana     Allergies   Amoxicillin, Penicillins, and Flagyl [metronidazole]   Review of Systems Review of Systems  Pertinent negatives listed in HPI Physical Exam Triage Vital Signs ED Triage Vitals  Enc Vitals Group     BP 02/06/20 1555 135/86     Pulse Rate 02/06/20 1555 85     Resp 02/06/20 1555 17  Temp 02/06/20 1555 98.9 F (37.2 C)     Temp Source 02/06/20 1555 Oral     SpO2 02/06/20 1555 100 %     Weight --      Height --      Head Circumference --      Peak Flow --      Pain Score 02/06/20 1558 0     Pain Loc --      Pain Edu? --      Excl. in GC? --    No data found.  Updated Vital Signs BP 135/86 (BP Location: Right Arm)   Pulse 85   Temp 98.9 F (37.2 C) (Oral)   Resp 17   LMP 02/05/2020   SpO2 100%   Visual Acuity Right Eye Distance:   Left Eye Distance:   Bilateral Distance:    Right Eye Near:   Left Eye Near:    Bilateral Near:     Physical Exam Constitutional:      Appearance: Normal appearance.  Cardiovascular:     Rate and Rhythm: Normal rate and regular rhythm.  Pulmonary:     Effort: Pulmonary effort is normal.     Breath sounds: Normal breath sounds.  Neurological:     Mental Status: She is  alert.  Psychiatric:        Mood and Affect: Mood is anxious.   Self collected cytology  UC Treatments / Results  Labs (all labs ordered are listed, but only abnormal results are displayed) Labs Reviewed  POCT URINE PREGNANCY  CERVICOVAGINAL ANCILLARY ONLY    EKG   Radiology No results found.  Procedures Procedures (including critical care time)  Medications Ordered in UC Medications - No data to display  Initial Impression / Assessment and Plan / UC Course  I have reviewed the triage vital signs and the nursing notes.  Pertinent labs & imaging results that were available during my care of the patient were reviewed by me and considered in my medical decision making (see chart for details).    Vaginal cytology pending. Given asymptomatic with known recent exposure will cover for GC/Chlaymydia with Rocephin and Doxycyline. Patient denied knowledge of any reaction to cephalosporins. Patient remained in clinic for 15 minutes following administration of medication, tolerated medication. Advised will not prescribed metronidazole or medication within the drug class given allergy to oral medication, and without cytology results. Reinforced the reasoning for deferring treatment of possible trichomoniasis now with flagyl as patient became insist that she receive treatment for trichomonas. Plan wait for cytology, if positive will address at that time. Final Clinical Impressions(s) / UC Diagndses   Final diagnoses:  Encounter for assessment of STD exposure     Discharge Instructions     Your results will be available within 3-5 days. You have been treated for Gonorrhea and Chlamydia. If any other medications are needed based on your results we will send medication to the pharmacy.     ED Prescriptions    Medication Sig Dispense Auth. Provider   doxycycline (VIBRAMYCIN) 100 MG capsule Take 1 capsule (100 mg total) by mouth 2 (two) times daily for 7 days. 14 capsule Bing Neighbors, FNP     PDMP not reviewed this encounter.   Bing Neighbors, FNP 02/07/20 [phone]

## 2020-02-06 NOTE — Telephone Encounter (Signed)
Having problems e-scribing [facility] to the [facility]. Med called into [facility] on [address]. Pt aware.

## 2020-02-06 NOTE — Discharge Instructions (Addendum)
Your results will be available within 3-5 days. You have been treated for Gonorrhea and Chlamydia. If any other medications are needed based on your results we will send medication to the pharmacy.

## 2020-02-06 NOTE — Telephone Encounter (Signed)
Pt called saying pharmacy still did not have script. Gave verbal to pharmacy for pt to pick up.
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
## 2020-02-07 ENCOUNTER — Telehealth: Payer: Self-pay | Admitting: Emergency Medicine

## 2020-02-07 ENCOUNTER — Ambulatory Visit (HOSPITAL_COMMUNITY): Payer: Self-pay

## 2020-02-07 LAB — RPR: RPR Ser Ql: NONREACTIVE

## 2020-02-07 LAB — HIV ANTIBODY (ROUTINE TESTING W REFLEX): HIV 1&2 Ab, 4th Generation: NONREACTIVE

## 2020-02-07 NOTE — Telephone Encounter (Signed)
Call back from [facility]- nurse unable to leave a message - voice mail not set up, patient aware. Pharmacy called - prescription listed as in progress, pharmacy confirmed doxycycline script was received electronically and was ready. Pt to pick up from [facility] today

## 2020-02-08 ENCOUNTER — Telehealth (HOSPITAL_COMMUNITY): Payer: Self-pay | Admitting: Emergency Medicine

## 2020-02-08 LAB — CERVICOVAGINAL ANCILLARY ONLY
Bacterial Vaginitis (gardnerella): POSITIVE — AB
Candida Glabrata: NEGATIVE
Candida Vaginitis: NEGATIVE
Chlamydia: NEGATIVE
Comment: NEGATIVE
Comment: NEGATIVE
Comment: NEGATIVE
Comment: NEGATIVE
Comment: NEGATIVE
Comment: NORMAL
Neisseria Gonorrhea: NEGATIVE
Trichomonas: NEGATIVE

## 2020-02-08 LAB — URINE CULTURE
MICRO NUMBER:: 11041735
Result:: NO GROWTH
SPECIMEN QUALITY:: ADEQUATE

## 2020-02-08 MED ORDER — CLINDAMYCIN PHOSPHATE 2 % VA CREA: 1.0000 | TOPICAL_CREAM | Freq: Every day | VAGINAL | 0 refills | Status: AC

## 2020-02-22 ENCOUNTER — Encounter (HOSPITAL_COMMUNITY): Payer: Self-pay

## 2020-02-22 ENCOUNTER — Ambulatory Visit (HOSPITAL_COMMUNITY)
Admission: EM | Admit: 2020-02-22 | Discharge: 2020-02-22 | Disposition: A | Discharge status: Home / Self Care | Payer: Medicaid Other | Attending: Urgent Care | Admitting: Urgent Care

## 2020-02-22 ENCOUNTER — Other Ambulatory Visit: Payer: Self-pay

## 2020-02-22 DIAGNOSIS — Z1152 Encounter for screening for COVID-19: Secondary | ICD-10-CM

## 2020-02-22 LAB — SARS CORONAVIRUS 2 (TAT 6-24 HRS): SARS Coronavirus 2: NEGATIVE

## 2020-02-22 NOTE — Discharge Instructions (Signed)

## 2020-02-22 NOTE — ED Triage Notes (Signed)
Pt present covid exposure from an employee. Pt states she is not having any symptoms at this time.

## 2020-04-28 ENCOUNTER — Other Ambulatory Visit: Payer: Self-pay

## 2020-04-28 ENCOUNTER — Emergency Department: Admit: 2020-04-28 | Discharge status: Absent without leave | Payer: Self-pay

## 2020-04-28 ENCOUNTER — Emergency Department
Admission: EM | Admit: 2020-04-28 | Discharge: 2020-04-28 | Disposition: A | Discharge status: Home / Self Care | Payer: Medicaid Other | Source: Home / Self Care

## 2020-04-28 ENCOUNTER — Other Ambulatory Visit (HOSPITAL_COMMUNITY)
Admission: RE | Admit: 2020-04-28 | Discharge: 2020-04-28 | Disposition: A | Discharge status: Home / Self Care | Payer: Medicaid Other | Source: Ambulatory Visit | Attending: Family Medicine | Admitting: Family Medicine

## 2020-04-28 ENCOUNTER — Encounter: Payer: Self-pay | Admitting: Family Medicine

## 2020-04-28 DIAGNOSIS — R202 Paresthesia of skin: Secondary | ICD-10-CM

## 2020-04-28 DIAGNOSIS — R200 Anesthesia of skin: Secondary | ICD-10-CM | POA: Diagnosis not present

## 2020-04-28 DIAGNOSIS — R1031 Right lower quadrant pain: Secondary | ICD-10-CM | POA: Diagnosis present

## 2020-04-28 DIAGNOSIS — G8929 Other chronic pain: Secondary | ICD-10-CM | POA: Insufficient documentation

## 2020-04-28 DIAGNOSIS — N912 Amenorrhea, unspecified: Secondary | ICD-10-CM

## 2020-04-28 DIAGNOSIS — J029 Acute pharyngitis, unspecified: Secondary | ICD-10-CM

## 2020-04-28 LAB — POCT CBC W AUTO DIFF (K'VILLE URGENT CARE)

## 2020-04-28 LAB — POCT URINALYSIS DIP (MANUAL ENTRY)
Bilirubin, UA: NEGATIVE
Blood, UA: NEGATIVE
Glucose, UA: NEGATIVE mg/dL
Ketones, POC UA: NEGATIVE mg/dL
Leukocytes, UA: NEGATIVE
Nitrite, UA: NEGATIVE
Protein Ur, POC: NEGATIVE mg/dL
Spec Grav, UA: >=1.03 — AB (ref 1.010–1.025)
Urobilinogen, UA: 0.2 E.U./dL
pH, UA: 7 (ref 5.0–8.0)

## 2020-04-28 MED ORDER — MEDROXYPROGESTERONE ACETATE 10 MG PO TABS: 10.0000 mg | ORAL_TABLET | Freq: Every day | ORAL | 0 refills | Status: DC
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
## 2020-04-28 NOTE — Discharge Instructions (Signed)
Covid test takes two days.  Call for neurological appointment  Get Provera prescription of period does not come on by January 1  Lab blood tests should be back tomorrow.
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
## 2020-04-29 LAB — CERVICOVAGINAL ANCILLARY ONLY
Chlamydia: NEGATIVE
Comment: NEGATIVE
Comment: NEGATIVE
Comment: NORMAL
Neisseria Gonorrhea: NEGATIVE
Trichomonas: NEGATIVE

## 2020-04-29 LAB — COMPLETE METABOLIC PANEL WITH GFR
AG Ratio: 1.6 (calc) (ref 1.0–2.5)
ALT: 16 U/L (ref 6–29)
AST: 17 U/L (ref 10–30)
Albumin: 4.6 g/dL (ref 3.6–5.1)
Alkaline phosphatase (APISO): 45 U/L (ref 31–125)
BUN: 9 mg/dL (ref 7–25)
CO2: 27 mmol/L (ref 20–32)
Calcium: 9.6 mg/dL (ref 8.6–10.2)
Chloride: 103 mmol/L (ref 98–110)
Creat: 0.64 mg/dL (ref 0.50–1.10)
GFR, Est African American: 139 mL/min/{1.73_m2} (ref >=60)
GFR, Est Non African American: 120 mL/min/{1.73_m2} (ref >=60)
Globulin: 2.9 g/dL (calc) (ref 1.9–3.7)
Glucose, Bld: 80 mg/dL (ref 65–99)
Potassium: 3.5 mmol/L (ref 3.5–5.3)
Sodium: 137 mmol/L (ref 135–146)
Total Bilirubin: 0.8 mg/dL (ref 0.2–1.2)
Total Protein: 7.5 g/dL (ref 6.1–8.1)

## 2020-04-29 LAB — VITAMIN B12: Vitamin B-12: 638 pg/mL (ref 200–1100)

## 2020-04-29 LAB — TSH: TSH: 1.86 mIU/L

## 2020-04-30 LAB — SARS-COV-2 RNA,(COVID-19) QUALITATIVE NAAT: SARS CoV2 RNA: DETECTED — AB

## 2020-05-07 ENCOUNTER — Ambulatory Visit (HOSPITAL_COMMUNITY)
Admission: EM | Admit: 2020-05-07 | Discharge: 2020-05-07 | Disposition: A | Discharge status: Home / Self Care | Payer: Medicaid Other | Attending: Emergency Medicine | Admitting: Emergency Medicine

## 2020-05-07 ENCOUNTER — Encounter (HOSPITAL_COMMUNITY): Payer: Self-pay

## 2020-05-07 DIAGNOSIS — Z20822 Contact with and (suspected) exposure to covid-19: Secondary | ICD-10-CM | POA: Diagnosis not present

## 2020-05-07 DIAGNOSIS — R202 Paresthesia of skin: Secondary | ICD-10-CM

## 2020-05-07 DIAGNOSIS — M25531 Pain in right wrist: Secondary | ICD-10-CM

## 2020-05-07 NOTE — Discharge Instructions (Signed)
Retesting today is not indicative of your contagiousness of covid-19. According to current and previous CDC guidelines and what we know about covid-19 it is not apparent that you are still spreading virus based on timing of symptoms onset and your testing. PCR testing does not indicate if you are contagious or not.  You can discontinue your isolation Because of your recent infection you do have natural immunity. You are technically able to receive vaccination anytime now that your isolation period is over.

## 2020-05-07 NOTE — ED Provider Notes (Addendum)
[facility]    CSN: [id number] Arrival date & time: 05/07/20  0854      History   Chief Complaint Chief Complaint  Patient presents with  . Cough    HPI Teresa Velez is a 30 y.o. female.   Teresa Velez presents with requests for covid-19 testing s/p covid-19 infection. Her symptoms of cough started >10 days ago. She tested positive on 12/27. She no longer has a cough, she does not have fevers and has not for >24 hours without any antipyretics. She has mild nasal congestion. She feels much improved. She requests repeat PCR testing as she is concerned about infecting others at work, or her child at home.   Also with concerns about paraesthesias to her right hand and her right foot. Writing worsens her hand symptoms. This has been ongoing for months but has been worse lately. 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 Sometimes she feels tingling to left side, such as if she lays on her left side. No known injuries. She feels discomfort to right wrist with increased use. Has been referred to neurology and has an appointment with them in March.   ROS per HPI, negative if not otherwise mentioned.      Past Medical History:  Diagnosis Date  . Migraines   . STD (sexually transmitted disease)    chlamydia treated 2011, HSV 2  . Trichomonal vaginitis 11/01/14  . Vaginal Pap smear, abnormal     Patient Active Problem List   Diagnosis Date Noted  . 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 IUGR (intrauterine growth restriction) affecting care of mother 04/09/2019  . IUGR (intrauterine growth retardation) affecting mother, third trimester, fetus 1 04/08/2019    Past Surgical History:  Procedure Laterality Date  . COLPOSCOPY  5/09   CINI    OB History    Gravida  1   Para  1   Term  1   Preterm  0   AB  0   Living  1     SAB  0   IAB  0   Ectopic  0   Multiple  0   Live Births  1            Home Medications     Prior to Admission medications   Medication Sig Start Date End Date Taking? Authorizing Provider  medroxyPROGESTERone (PROVERA) 10 MG tablet Take 1 tablet (10 mg total) by mouth daily. 04/28/20   Elvina Sidle, MD    Family History Family History  Problem Relation Age of Onset  . Hypertension Maternal Grandmother   . Healthy Mother   . Healthy Father     Social History Social History   Tobacco Use  . Smoking status: Never Smoker  . Smokeless tobacco: Never Used  Vaping Use  . Vaping Use: Never used  Substance Use Topics  . Alcohol use: Yes  . Drug use: Not Currently    Types: Marijuana     Allergies   Metronidazole, Amoxicillin, and Penicillins   Review of Systems Review of Systems   Physical Exam Triage Vital Signs ED Triage Vitals  Enc Vitals Group     BP 05/07/20 1035 127/81     Pulse Rate 05/07/20 1035 81     Resp 05/07/20 1035 18     Temp 05/07/20 1035 98.1 F (36.7 C)     Temp Source 05/07/20 1035 Oral  SpO2 05/07/20 1035 100 %     Weight 05/07/20 1037 135 lb (61.2 kg)     Height 05/07/20 1037 5\' 3"  (1.6 m)     Head Circumference --      Peak Flow --      Pain Score 05/07/20 1031 0     Pain Loc --      Pain Edu? --      Excl. in GC? --    No data found.  Updated Vital Signs BP 127/81 (BP Location: Right Arm)   Pulse 81   Temp 98.1 F (36.7 C) (Oral)   Resp 18   Ht 5\' 3"  (1.6 m)   Wt 135 lb (61.2 kg)   LMP 04/11/2020 (Exact Date)   SpO2 100%   BMI 23.91 kg/m   Visual Acuity Right Eye Distance:   Left Eye Distance:   Bilateral Distance:    Right Eye Near:   Left Eye Near:    Bilateral Near:     Physical Exam Constitutional:      General: She is not in acute distress.    Appearance: She is well-developed.  Cardiovascular:     Rate and Rhythm: Normal rate.  Pulmonary:     Effort: Pulmonary effort is normal.  Musculoskeletal:        General: No swelling or deformity. Normal range of motion.  Skin:    General: Skin  is warm and dry.  Neurological:     Mental Status: She is alert and oriented to person, place, and time.      UC Treatments / Results  Labs (all labs ordered are listed, but only abnormal results are displayed) Labs Reviewed - No data to display  EKG   Radiology No results found.  Procedures Procedures (including critical care time)  Medications Ordered in UC Medications - No data to display  Initial Impression / Assessment and Plan / UC Course  I have reviewed the triage vital signs and the nursing notes.  Pertinent labs & imaging results that were available during my care of the patient were reviewed by me and considered in my medical decision making (see chart for details).     Discussed PCR testing at length with patient, as well as current and even previous CDC recommendations, which do not recommend re-testing. Test provided today per patient request as she is very concerned about the result of this. Vaccination recommendations discussed as well. Patient verbalized understanding and agreeable to plan.   Carpal tunnel to right hand? Wrist brace provided as well as nsaids. PCP and/or sports medicine recommendations also provided as nerve conduction testing may be appropriate as well.   Final Clinical Impressions(s) / UC Diagnoses   Final diagnoses:  Encounter for laboratory testing for COVID-19 virus     Discharge Instructions     Retesting today is not indicative of your contagiousness of covid-19. According to current and previous CDC guidelines and what we know about covid-19 it is not apparent that you are still spreading virus based on timing of symptoms onset and your testing. PCR testing does not indicate if you are contagious or not.  You can discontinue your isolation Because of your recent infection you do have natural immunity. You are technically able to receive vaccination anytime now that your isolation period is over.    ED Prescriptions    None      PDMP not reviewed this encounter.   , NP 05/07/20 1114    Yennifer Segovia,  Malachy Moan, NP 05/07/20 1205

## 2020-05-07 NOTE — ED Triage Notes (Signed)
Pt c/o cough for more than 2 weeks, had positive COVID test 9 days ago. Pt states her employer is asking her to return to work, but pt is uncertain if she is still contagious or not and is seeking medical guidance for return to work.  Pt states her URI symptoms are improving, but congestion remains with mild hoarseness. Also reports numbness to right side of body and "shifts to left with movement", to hands/arms, legs/feet. Reports she has referral from urgent care to neurology in march for complaints.   Denies fever, n/v.

## 2020-05-08 ENCOUNTER — Ambulatory Visit: Payer: Self-pay

## 2020-07-08 ENCOUNTER — Ambulatory Visit: Payer: No Typology Code available for payment source | Admitting: Neurology

## 2020-07-08 ENCOUNTER — Telehealth: Payer: Self-pay | Admitting: *Deleted

## 2020-07-08 ENCOUNTER — Encounter: Payer: Self-pay | Admitting: Neurology

## 2020-07-08 NOTE — Progress Notes (Deleted)
[id number]    Provider:  Dr Lucia Gaskins Requesting Provider: Elvina Sidle, MD Primary Care Provider:  Roma Kayser ([facility])  CC:  Ankle pain  HPI:  Teresa Velez is a 30 y.o. female here as requested by Elvina Sidle, MD for paresthesias.  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 Pertinent negatives and positives per HPI. All others negative.   Social History   Socioeconomic History  . Marital status: Single    Spouse name: Not on file  . Number of children: Not on file  . Years of education: Not on file  . Highest education level: Not on file  Occupational History  . Not on file  Tobacco Use  . Smoking status: Never Smoker  . Smokeless tobacco: Never Used  Vaping Use  . Vaping Use: Never used  Substance and Sexual Activity  . Alcohol use: Yes  . Drug use: Not Currently    Types: Marijuana  . Sexual activity: Yes    Partners: Male    Birth control/protection: None  Other Topics Concern  . Not on file  Social History Narrative   ** Merged History Encounter **       Social Determinants of Health   Financial Resource Strain: Not on file  Food Insecurity: Not on file  Transportation Needs: Not on file  Physical Activity: Not on file  Stress: Not on file  Social Connections: Not on file  Intimate Partner Violence: Not on file    Family History  Problem Relation Age of  Onset  . Hypertension Maternal Grandmother   . Healthy Mother   . Healthy Father     Past Medical History:  Diagnosis Date  . Migraines   . STD (sexually transmitted disease)    chlamydia treated 2011, HSV 2  . Trichomonal vaginitis 11/01/14  . Vaginal Pap smear, abnormal     Patient Active Problem List   Diagnosis Date Noted  . 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 IUGR (intrauterine growth restriction) affecting care of mother 04/09/2019  . IUGR (intrauterine growth retardation) affecting mother, third trimester, fetus 1 04/08/2019    Past Surgical History:  Procedure Laterality Date  . COLPOSCOPY  5/09   CINI    Current Outpatient Medications  Medication Sig Dispense Refill  . medroxyPROGESTERone (PROVERA) 10 MG tablet Take 1 tablet (10 mg total) by mouth daily. 10 tablet 0   No current facility-administered medications for this visit.    Allergies as of 07/08/2020 - In Progress 05/07/2020  Allergen Reaction Noted  . Metronidazole Itching, Rash, and Hives 11/08/2014  . Amoxicillin Hives 05/06/2012  . Penicillins Hives 05/06/2012    Vitals: There were no vitals taken for this visit. Last Weight:  Wt Readings from Last 1 Encounters:  05/07/20 135 lb (61.2 kg)   Last Height:   Ht Readings from Last 1 Encounters:  05/07/20 5\' 3"  (1.6 m)  Physical exam: Exam: Gen: NAD, conversant, well nourised, obese, well groomed                     CV: RRR, no MRG. No Carotid Bruits. No peripheral edema, warm, nontender Eyes: Conjunctivae clear without exudates or hemorrhage  Neuro: Detailed Neurologic Exam  Speech:    Speech is normal; fluent and spontaneous with normal comprehension.  Cognition:    The patient is oriented to person, place, and time;     recent and remote memory intact;     language fluent;     normal attention, concentration,     fund of knowledge Cranial Nerves:    The pupils are equal, round, and reactive to light. The  fundi are normal and spontaneous venous pulsations are present. Visual fields are full to finger confrontation. Extraocular movements are intact. Trigeminal sensation is intact and the muscles of mastication are normal. The face is symmetric. The palate elevates in the midline. Hearing intact. Voice is normal. Shoulder shrug is normal. The tongue has normal motion without fasciculations.   Coordination:    Normal finger to nose and heel to shin. Normal rapid alternating movements.   Gait:    Heel-toe and tandem gait are normal.   Motor Observation:    No asymmetry, no atrophy, and no involuntary movements noted. Tone:    Normal muscle tone.    Posture:    Posture is normal. normal erect    Strength:    Strength is V/V in the upper and lower limbs.      Sensation: intact to LT     Reflex Exam:  DTR's:    Deep tendon reflexes in the upper and lower extremities are normal bilaterally.   Toes:    The toes are downgoing bilaterally.   Clonus:    Clonus is absent.    Assessment/Plan:    No orders of the defined types were placed in this encounter.  No orders of the defined types were placed in this encounter.   Cc: Elvina Sidle, MD,  Patient, No Pcp Per  Naomie Dean, MD  [facility] [address] [place], [place] [address]  Phone [phone] Fax [phone]

## 2020-07-08 NOTE — Telephone Encounter (Signed)
Pt no showed new patient appt today.  

## 2020-07-26 ENCOUNTER — Ambulatory Visit (HOSPITAL_COMMUNITY)
Admission: EM | Admit: 2020-07-26 | Discharge: 2020-07-26 | Disposition: A | Discharge status: Home / Self Care | Payer: Medicaid Other | Attending: Family Medicine | Admitting: Family Medicine

## 2020-07-26 ENCOUNTER — Other Ambulatory Visit: Payer: Self-pay

## 2020-07-26 ENCOUNTER — Encounter (HOSPITAL_COMMUNITY): Payer: Self-pay | Admitting: Emergency Medicine

## 2020-07-26 DIAGNOSIS — R630 Anorexia: Secondary | ICD-10-CM | POA: Diagnosis not present

## 2020-07-26 DIAGNOSIS — R5383 Other fatigue: Secondary | ICD-10-CM | POA: Diagnosis present

## 2020-07-26 DIAGNOSIS — R634 Abnormal weight loss: Secondary | ICD-10-CM | POA: Insufficient documentation

## 2020-07-26 DIAGNOSIS — R1032 Left lower quadrant pain: Secondary | ICD-10-CM | POA: Diagnosis not present

## 2020-07-26 LAB — COMPREHENSIVE METABOLIC PANEL
ALT: 17 U/L (ref 0–44)
AST: 20 U/L (ref 15–41)
Albumin: 4.3 g/dL (ref 3.5–5.0)
Alkaline Phosphatase: 38 U/L (ref 38–126)
Anion gap: 7 (ref 5–15)
BUN: 11 mg/dL (ref 6–20)
CO2: 25 mmol/L (ref 22–32)
Calcium: 9.6 mg/dL (ref 8.9–10.3)
Chloride: 105 mmol/L (ref 98–111)
Creatinine, Ser: 0.7 mg/dL (ref 0.44–1.00)
GFR, Estimated: >60 mL/min (ref >60)
Glucose, Bld: 88 mg/dL (ref 70–99)
Potassium: 4 mmol/L (ref 3.5–5.1)
Sodium: 137 mmol/L (ref 135–145)
Total Bilirubin: 0.7 mg/dL (ref 0.3–1.2)
Total Protein: 7.4 g/dL (ref 6.5–8.1)

## 2020-07-26 LAB — POCT URINALYSIS DIPSTICK, ED / UC
Bilirubin Urine: NEGATIVE
Glucose, UA: NEGATIVE mg/dL
Hgb urine dipstick: NEGATIVE
Ketones, ur: NEGATIVE mg/dL
Leukocytes,Ua: NEGATIVE
Nitrite: NEGATIVE
Protein, ur: NEGATIVE mg/dL
Specific Gravity, Urine: 1.025 (ref 1.005–1.030)
Urobilinogen, UA: 0.2 mg/dL (ref 0.0–1.0)
pH: 7 (ref 5.0–8.0)

## 2020-07-26 LAB — CBC WITH DIFFERENTIAL/PLATELET
Abs Immature Granulocytes: 0.01 10*3/uL (ref 0.00–0.07)
Basophils Absolute: 0 10*3/uL (ref 0.0–0.1)
Basophils Relative: 1 %
Eosinophils Absolute: 0 10*3/uL (ref 0.0–0.5)
Eosinophils Relative: 1 %
HCT: 42.3 % (ref 36.0–46.0)
Hemoglobin: 14.2 g/dL (ref 12.0–15.0)
Immature Granulocytes: 0 %
Lymphocytes Relative: 50 %
Lymphs Abs: 2.5 10*3/uL (ref 0.7–4.0)
MCH: 30 pg (ref 26.0–34.0)
MCHC: 33.6 g/dL (ref 30.0–36.0)
MCV: 89.2 fL (ref 80.0–100.0)
Monocytes Absolute: 0.3 10*3/uL (ref 0.1–1.0)
Monocytes Relative: 7 %
Neutro Abs: 2 10*3/uL (ref 1.7–7.7)
Neutrophils Relative %: 41 %
Platelets: 345 10*3/uL (ref 150–400)
RBC: 4.74 MIL/uL (ref 3.87–5.11)
RDW: 13.4 % (ref 11.5–15.5)
WBC: 4.9 10*3/uL (ref 4.0–10.5)
nRBC: 0 % (ref 0.0–0.2)

## 2020-07-26 LAB — LIPASE, BLOOD: Lipase: 30 U/L (ref 11–51)

## 2020-07-26 LAB — TSH: TSH: 0.973 u[IU]/mL (ref 0.350–4.500)

## 2020-07-26 NOTE — ED Triage Notes (Signed)
Pt presents today with left sided abd pain x 2 weeks, decreased appetite x 1 month, nasal congestion x2 days.

## 2020-07-26 NOTE — ED Provider Notes (Signed)
[facility]    CSN: [id number] Arrival date & time: 07/26/20  1231      History   Chief Complaint Chief Complaint  Patient presents with  . Nasal Congestion  . Abdominal Pain  . Fatigue  . Anorexia    HPI Teresa Velez is a 30 y.o. female.   Patient presenting today with multiple complaints.  She's had several days of nasal congestion which is been happening off and on.  Daughter has been sick off and on from daycare exposures so she thinks this might be related to that.  Denies fever, chills, chest pain, shortness of breath, abdominal pain, nausea vomiting diarrhea.  She also complains of about 3 months of intermittent sharp episodes of left lower abdominal pain that she has not been able to isolate a trigger for, not currently happening.  Denies any associated hematuria, dysuria, flank pain, vaginal symptoms.  Bowel movements normal for patient.  She is also complaining of a lack of appetite the past few months and a 20 to 25 pound weight loss within this time.  She states she can go several days without eating before even realizing she is hungry.  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  Does not currently have a primary care provider.     Past Medical History:  Diagnosis Date  . Migraines   . STD (sexually transmitted disease)    chlamydia treated 2011, HSV 2  . Trichomonal vaginitis 11/01/14  . Vaginal Pap smear, abnormal     Patient Active Problem List   Diagnosis Date Noted  . 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 IUGR (intrauterine growth restriction) affecting care of mother 04/09/2019  . IUGR (intrauterine growth retardation) affecting mother, third trimester, fetus 1 04/08/2019    Past Surgical History:  Procedure Laterality Date  . COLPOSCOPY  5/09   CINI    OB History    Gravida  1   Para  1   Term  1   Preterm  0   AB  0   Living  1      SAB  0   IAB  0   Ectopic  0   Multiple  0   Live Births  1            Home Medications    Prior to Admission medications   Medication Sig Start Date End Date Taking? Authorizing Provider  medroxyPROGESTERone (PROVERA) 10 MG tablet Take 1 tablet (10 mg total) by mouth daily. 04/28/20   Elvina Sidle, MD    Family History Family History  Problem Relation Age of Onset  . Hypertension Maternal Grandmother   . Healthy Mother   . Healthy Father     Social History Social History   Tobacco Use  . Smoking status: Never Smoker  . Smokeless tobacco: Never Used  Vaping Use  . Vaping Use: Never used  Substance Use Topics  . Alcohol use: Yes  . Drug use: Not Currently    Types: Marijuana     Allergies   Metronidazole, Amoxicillin, and Penicillins   Review of Systems Review of Systems Per HPI  Physical Exam Triage Vital Signs ED Triage Vitals  Enc Vitals Group     BP 07/26/20 1336 (!) 114/56     Pulse Rate 07/26/20 1336 94     Resp 07/26/20 1336 16     Temp 07/26/20 1336 (!)  97.3 F (36.3 C)     Temp Source 07/26/20 1336 Tympanic     SpO2 07/26/20 1336 97 %     Weight --      Height --      Head Circumference --      Peak Flow --      Pain Score 07/26/20 1458 0     Pain Loc --      Pain Edu? --      Excl. in GC? --    No data found.  Updated Vital Signs BP (!) 114/56 (BP Location: Left Arm)   Pulse 94   Temp (!) 97.3 F (36.3 C) (Tympanic)   Resp 16   LMP 07/20/2020   SpO2 97%   Visual Acuity Right Eye Distance:   Left Eye Distance:   Bilateral Distance:    Right Eye Near:   Left Eye Near:    Bilateral Near:     Physical Exam Vitals and nursing note reviewed.  Constitutional:      Appearance: Normal appearance. She is not ill-appearing.  HENT:     Head: Atraumatic.  Eyes:     Extraocular Movements: Extraocular movements intact.     Conjunctiva/sclera: Conjunctivae normal.  Cardiovascular:     Rate and Rhythm: Normal rate and  regular rhythm.     Heart sounds: Normal heart sounds.  Pulmonary:     Effort: Pulmonary effort is normal.     Breath sounds: Normal breath sounds.  Abdominal:     General: Bowel sounds are normal. There is no distension.     Palpations: Abdomen is soft.     Tenderness: There is no abdominal tenderness. There is no right CVA tenderness, left CVA tenderness or guarding.  Musculoskeletal:        General: Normal range of motion.     Cervical back: Normal range of motion and neck supple.  Skin:    General: Skin is warm and dry.  Neurological:     Mental Status: She is alert and oriented to person, place, and time.  Psychiatric:        Mood and Affect: Mood normal.        Thought Content: Thought content normal.        Judgment: Judgment normal.      UC Treatments / Results  Labs (all labs ordered are listed, but only abnormal results are displayed) Labs Reviewed  CBC WITH DIFFERENTIAL/PLATELET  COMPREHENSIVE METABOLIC PANEL  TSH  LIPASE, BLOOD  POCT URINALYSIS DIPSTICK, ED / UC  CERVICOVAGINAL ANCILLARY ONLY    EKG   Radiology No results found.  Procedures Procedures (including critical care time)  Medications Ordered in UC Medications - No data to display  Initial Impression / Assessment and Plan / UC Course  I have reviewed the triage vital signs and the nursing notes.  Pertinent labs & imaging results that were available during my care of the patient were reviewed by me and considered in my medical decision making (see chart for details).     Exam and vital signs benign today, UA within normal limits.  Await blood work results for rule out common causes of her symptoms.  Allergies versus viral URI for her congestion today.  She does have frequent exposures to viruses based on her child being in daycare -recommended starting Zyrtec to rule out allergy component.  Do suspect other symptoms largely related to stressors.  Recommended scheduling healthy balanced  meals, close primary care follow-up as soon as  established.  Resources given for this today.  Return for acutely worsening symptoms.  Final Clinical Impressions(s) / UC Diagnoses   Final diagnoses:  Left lower quadrant abdominal pain  Fatigue, unspecified type  Loss of weight  Decreased appetite   Discharge Instructions   None    ED Prescriptions    None     PDMP not reviewed this encounter.   Particia Nearing, [place] 07/26/20 1650

## 2020-07-28 LAB — CERVICOVAGINAL ANCILLARY ONLY
Bacterial Vaginitis (gardnerella): POSITIVE — AB
Candida Glabrata: NEGATIVE
Candida Vaginitis: NEGATIVE
Chlamydia: NEGATIVE
Comment: NEGATIVE
Comment: NEGATIVE
Comment: NEGATIVE
Comment: NEGATIVE
Comment: NEGATIVE
Comment: NORMAL
Neisseria Gonorrhea: NEGATIVE
Trichomonas: NEGATIVE

## 2020-07-29 ENCOUNTER — Telehealth (HOSPITAL_COMMUNITY): Payer: Self-pay | Admitting: Emergency Medicine

## 2020-07-29 MED ORDER — CLINDAMYCIN PHOSPHATE 2 % VA CREA: 1.0000 | TOPICAL_CREAM | Freq: Every day | VAGINAL | 0 refills | Status: DC

## 2020-12-22 ENCOUNTER — Encounter (HOSPITAL_COMMUNITY): Payer: Self-pay | Admitting: Emergency Medicine

## 2020-12-22 ENCOUNTER — Other Ambulatory Visit: Payer: Self-pay

## 2020-12-22 ENCOUNTER — Emergency Department (HOSPITAL_COMMUNITY): Payer: Medicaid Other

## 2020-12-22 ENCOUNTER — Emergency Department (HOSPITAL_COMMUNITY)
Admission: EM | Admit: 2020-12-22 | Discharge: 2020-12-23 | Disposition: A | Discharge status: Home / Self Care | Payer: Medicaid Other | Attending: Emergency Medicine | Admitting: Emergency Medicine

## 2020-12-22 DIAGNOSIS — G43809 Other migraine, not intractable, without status migrainosus: Secondary | ICD-10-CM | POA: Diagnosis not present

## 2020-12-22 DIAGNOSIS — N9489 Other specified conditions associated with female genital organs and menstrual cycle: Secondary | ICD-10-CM | POA: Insufficient documentation

## 2020-12-22 DIAGNOSIS — R112 Nausea with vomiting, unspecified: Secondary | ICD-10-CM | POA: Diagnosis present

## 2020-12-22 LAB — CBC WITH DIFFERENTIAL/PLATELET
Abs Immature Granulocytes: 0.03 10*3/uL (ref 0.00–0.07)
Basophils Absolute: 0.1 10*3/uL (ref 0.0–0.1)
Basophils Relative: 1 %
Eosinophils Absolute: 0 10*3/uL (ref 0.0–0.5)
Eosinophils Relative: 0 %
HCT: 42.8 % (ref 36.0–46.0)
Hemoglobin: 14.8 g/dL (ref 12.0–15.0)
Immature Granulocytes: 0 %
Lymphocytes Relative: 23 %
Lymphs Abs: 2.7 10*3/uL (ref 0.7–4.0)
MCH: 30.5 pg (ref 26.0–34.0)
MCHC: 34.6 g/dL (ref 30.0–36.0)
MCV: 88.1 fL (ref 80.0–100.0)
Monocytes Absolute: 0.6 10*3/uL (ref 0.1–1.0)
Monocytes Relative: 5 %
Neutro Abs: 8.2 10*3/uL — ABNORMAL HIGH (ref 1.7–7.7)
Neutrophils Relative %: 71 %
Platelets: 344 10*3/uL (ref 150–400)
RBC: 4.86 MIL/uL (ref 3.87–5.11)
RDW: 12.6 % (ref 11.5–15.5)
WBC: 11.6 10*3/uL — ABNORMAL HIGH (ref 4.0–10.5)
nRBC: 0 % (ref 0.0–0.2)

## 2020-12-22 LAB — COMPREHENSIVE METABOLIC PANEL
ALT: 21 U/L (ref 0–44)
AST: 20 U/L (ref 15–41)
Albumin: 4.3 g/dL (ref 3.5–5.0)
Alkaline Phosphatase: 34 U/L — ABNORMAL LOW (ref 38–126)
Anion gap: 12 (ref 5–15)
BUN: 16 mg/dL (ref 6–20)
CO2: 19 mmol/L — ABNORMAL LOW (ref 22–32)
Calcium: 9.6 mg/dL (ref 8.9–10.3)
Chloride: 103 mmol/L (ref 98–111)
Creatinine, Ser: 0.69 mg/dL (ref 0.44–1.00)
GFR, Estimated: >60 mL/min (ref >60)
Glucose, Bld: 117 mg/dL — ABNORMAL HIGH (ref 70–99)
Potassium: 3.7 mmol/L (ref 3.5–5.1)
Sodium: 134 mmol/L — ABNORMAL LOW (ref 135–145)
Total Bilirubin: 0.8 mg/dL (ref 0.3–1.2)
Total Protein: 7.8 g/dL (ref 6.5–8.1)

## 2020-12-22 LAB — I-STAT BETA HCG BLOOD, ED (MC, WL, AP ONLY): I-stat hCG, quantitative: <5 m[IU]/mL (ref <5)

## 2020-12-22 MED ORDER — ONDANSETRON 4 MG PO TBDP
4.0000 mg | ORAL_TABLET | Freq: Once | ORAL | Status: AC
  Administered 2020-12-22: 4 mg via ORAL
  Filled 2020-12-22: qty 1

## 2020-12-22 NOTE — ED Notes (Signed)
Patient transported to CT 

## 2020-12-22 NOTE — ED Provider Notes (Signed)
Emergency Medicine Provider Triage Evaluation Note  Taletha T. Nedrow , a 30 y.o. female  was evaluated in triage.  Pt complains of headache.  She states that about an hour ago she had onset of headache.  It was 10 out of 10 at the start.  She states that she had just eaten something prior to her headache.  She states that she has had migraines in the past however this feels significantly different.  She has been vomiting since the headache started.  She states that prior to the headache starting about an hour prior to arrival she has been well recently.  She denies any head trauma..  Review of Systems  Positive: Headache, vomiting Negative: weakness  Physical Exam  BP 126/80 (BP Location: Right Arm)   Pulse 95   Temp (!) 97.4 F (36.3 C) (Oral)   Resp 15   SpO2 100%  Gen:   Awake, actively vomiting, appears in pain Resp:  Normal effort  MSK:   Moves extremities without difficulty  Other:  Speech is not slurred  Medical Decision Making  Medically screening exam initiated at 8:46 PM.  Appropriate orders placed.  Henriette T. Kotlyar was informed that the remainder of the evaluation will be completed by another provider, this initial triage assessment does not replace that evaluation, and the importance of remaining in the ED until their evaluation is complete.   2047: Charge RN notified about concerns for patient with headache severity. CT called to expedite scan.     Norman Clay 12/23/20 1841    Koleen Distance, MD 12/25/20 1700

## 2020-12-22 NOTE — ED Triage Notes (Signed)
Patient presents with multiple complaints: headache with emesis and SOB with fatigue onset today . No fever or chills.

## 2020-12-23 ENCOUNTER — Emergency Department (HOSPITAL_COMMUNITY): Payer: Medicaid Other

## 2020-12-23 MED ORDER — SODIUM CHLORIDE 0.9 % IV BOLUS
1000.0000 mL | Freq: Once | INTRAVENOUS | Status: AC
  Administered 2020-12-23: 1000 mL via INTRAVENOUS

## 2020-12-23 MED ORDER — KETOROLAC TROMETHAMINE 15 MG/ML IJ SOLN
15.0000 mg | Freq: Once | INTRAMUSCULAR | Status: AC
  Administered 2020-12-23: 15 mg via INTRAVENOUS
  Filled 2020-12-23: qty 1

## 2020-12-23 MED ORDER — IOHEXOL 350 MG/ML SOLN
75.0000 mL | Freq: Once | INTRAVENOUS | Status: AC | PRN
  Administered 2020-12-23: 75 mL via INTRAVENOUS

## 2020-12-23 MED ORDER — DIPHENHYDRAMINE HCL 50 MG/ML IJ SOLN
25.0000 mg | Freq: Once | INTRAMUSCULAR | Status: AC
  Administered 2020-12-23: 25 mg via INTRAVENOUS
  Filled 2020-12-23: qty 1

## 2020-12-23 MED ORDER — PROCHLORPERAZINE EDISYLATE 10 MG/2ML IJ SOLN
10.0000 mg | Freq: Once | INTRAMUSCULAR | Status: AC
  Administered 2020-12-23: 10 mg via INTRAVENOUS
  Filled 2020-12-23: qty 2

## 2020-12-23 NOTE — Discharge Instructions (Addendum)
You were evaluated in the Emergency Department and after careful evaluation, we did not find any emergent condition requiring admission or further testing in the hospital.  Your exam/testing today was overall reassuring.  Please return to the Emergency Department if you experience any worsening of your condition.  Thank you for allowing us to be a part of your care.  

## 2020-12-23 NOTE — ED Provider Notes (Signed)
[facility]-EM[facility]GENCY DEPT [facility] Emergency Department Provider Note MRN:  [id number]  Arrival date & time: 12/23/20     Chief Complaint   Headace;Emesis;SOB   History of Present Illness   Teresa Velez is a 30 y.o. year-old female with a history of migraines presenting to the ED with chief complaint of headache.  Location: Frontal headache Duration: 1 day Onset: Gradual Timing: Constant Description: Dull Severity: Moderate to severe Exacerbating/Alleviating Factors: None Associated Symptoms: Nausea and vomiting Pertinent Negatives: Denies fever, no neck pain, no chest pain or shortness of breath, no abdominal pain  Additional History: None  Review of Systems  A complete 10 system review of systems was obtained and all systems are negative except as noted in the HPI and PMH.   Patient's Health History    Past Medical History:  Diagnosis Date   Migraines    STD (sexually transmitted disease)    chlamydia treated 2011, HSV 2   Trichomonal vaginitis 11/01/14   Vaginal Pap smear, abnormal     Past Surgical History:  Procedure Laterality Date   COLPOSCOPY  5/09   CINI    Family History  Problem Relation Age of Onset   Hypertension Maternal Grandmother    Healthy Mother    Healthy Father     Social History   Socioeconomic History   Marital status: Single    Spouse name: Not on file   Number of children: Not on file   Years of education: Not on file   Highest education level: Not on file  Occupational History   Not on file  Tobacco Use   Smoking status: Never   Smokeless tobacco: Never  Vaping Use   Vaping Use: Never used  Substance and Sexual Activity   Alcohol use: Yes   Drug use: Not Currently    Types: Marijuana   Sexual activity: Yes    Partners: Male    Birth control/protection: None  Other Topics Concern   Not on file  Social History Narrative   ** Merged History Encounter **       Social Determinants of Health   Financial Resource  Strain: Not on file  Food Insecurity: Not on file  Transportation Needs: Not on file  Physical Activity: Not on file  Stress: Not on file  Social Connections: Not on file  Intimate Partner Violence: Not on file     Physical Exam   Vitals:   12/23/20 0630 12/23/20 0700  BP: 104/67 99/69  Pulse: 70 70  Resp:    Temp:    SpO2: 100% 99%    CONSTITUTIONAL: Well-appearing, NAD NEURO:  Alert and oriented x 3, no focal deficits EYES:  eyes equal and reactive ENT/NECK:  no LAD, no JVD CARDIO: Regular rate, well-perfused, normal S1 and S2 PULM:  CTAB no wheezing or rhonchi GI/GU:  normal bowel sounds, non-distended, non-tender MSK/SPINE:  No gross deformities, no edema SKIN:  no rash, atraumatic PSYCH:  Appropriate speech and behavior  *Additional and/or pertinent findings included in MDM below  Diagnostic and Interventional Summary    EKG Interpretation  Date/Time:    Ventricular Rate:    PR Interval:    QRS Duration:   QT Interval:    QTC Calculation:   R Axis:     Text Interpretation:         Labs Reviewed  COMPREHENSIVE METABOLIC PANEL - Abnormal; Notable for the following components:      Result Value   Sodium 134 (*)  CO2 19 (*)    Glucose, Bld 117 (*)    Alkaline Phosphatase 34 (*)    All other components within normal limits  CBC WITH DIFFERENTIAL/PLATELET - Abnormal; Notable for the following components:   WBC 11.6 (*)    Neutro Abs 8.2 (*)    All other components within normal limits  I-STAT BETA HCG BLOOD, ED ([facility], [facility], [facility] ONLY)    CT Angio Head W or Wo Contrast  Final Result    CT HEAD WO CONTRAST ( )  Final Result      Medications  ondansetron (ZOFRAN-ODT) disintegrating tablet 4 mg (4 mg Oral Given 12/22/20 2054)  iohexol (OMNIPAQUE) 350 MG/ML injection 75 mL (75 mLs Intravenous Contrast Given 12/23/20 0329)  prochlorperazine (COMPAZINE) injection 10 mg (10 mg Intravenous Given 12/23/20 0459)  diphenhydrAMINE (BENADRYL) injection 25 mg (25  mg Intravenous Given 12/23/20 0459)  ketorolac (TORADOL) 15 MG/ML injection 15 mg (15 mg Intravenous Given 12/23/20 0500)  sodium chloride 0.9 % bolus 1,000 mL (1,000 mLs Intravenous New Bag/Given 12/23/20 0500)     Procedures  /  Critical Care Procedures  ED Course and Medical Decision Making  I have reviewed the triage vital signs, the nursing notes, and pertinent available records from the EMR.  Listed above are laboratory and imaging tests that I personally ordered, reviewed, and interpreted and then considered in my medical decision making (see below for details).  Migraine versus food poisoning.  No abdominal pain or tenderness.  Normal vital signs.  Reassuring neurological exam with no focal deficits.  CT imaging is normal.  A bit dehydrated on labs, providing fluids, migraine cocktail and will reassess.     Patient feeling all the way better after migraine cocktail, appropriate for discharge.  Elmer Sow. Pilar Plate, MD [facility] Emergency Medicine [facility] [email or web address]  Final Clinical Impressions(s) / ED Diagnoses     ICD-10-CM   1. Other migraine without status migrainosus, not intractable  G43.809       ED Discharge Orders     None        Discharge Instructions Discussed with and Provided to Patient:     Discharge Instructions      You were evaluated in the Emergency Department and after careful evaluation, we did not find any emergent condition requiring admission or further testing in the hospital.  Your exam/testing today was overall reassuring.  Please return to the Emergency Department if you experience any worsening of your condition.  Thank you for allowing [place] to be a part of your care.         Sabas Sous, MD 12/23/20 [phone]

## 2021-01-26 DIAGNOSIS — R87612 Low grade squamous intraepithelial lesion on cytologic smear of cervix (LGSIL): Secondary | ICD-10-CM | POA: Insufficient documentation

## 2021-01-31 IMAGING — US US MFM UA CORD DOPPLER
1 series · 13 of 28 positions shown · non-contrast
Comparison: none

[Series 1: us mfm ua cord doppler · 13 of 41 slices shown]
[im 2/41]
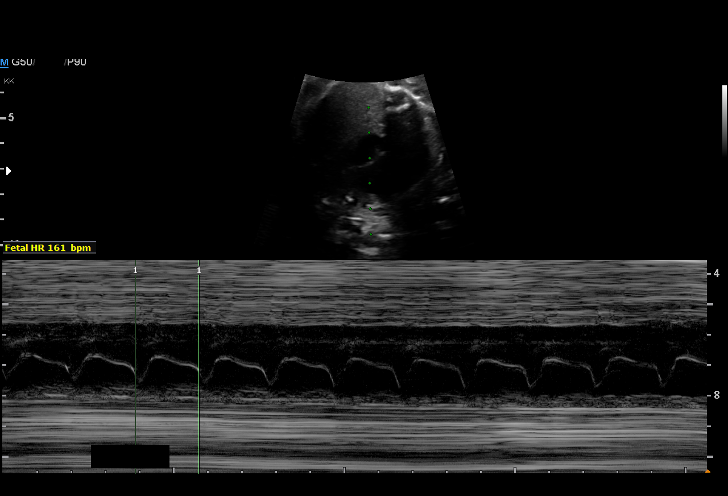
[im 5/41]
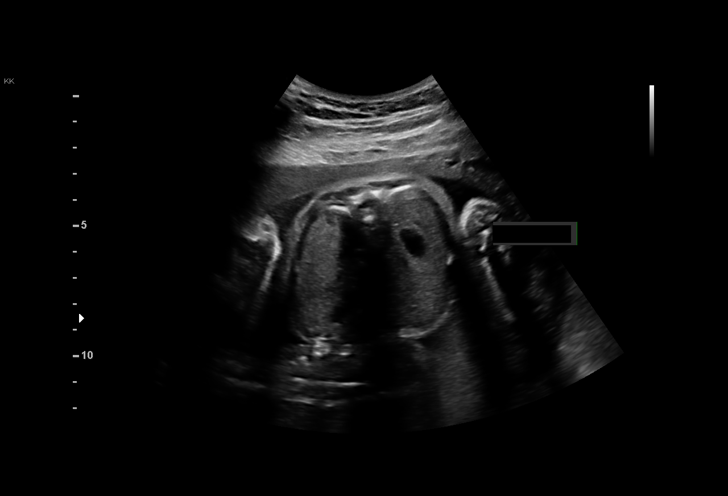
[im 8/41]
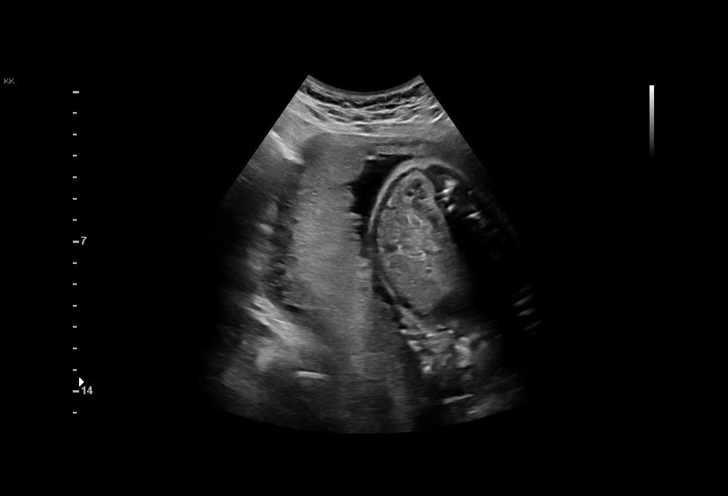
[im 11/41]
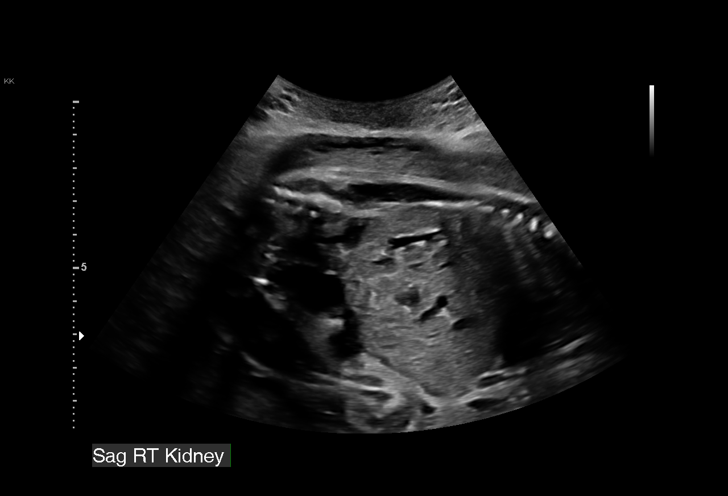
[im 14/41]
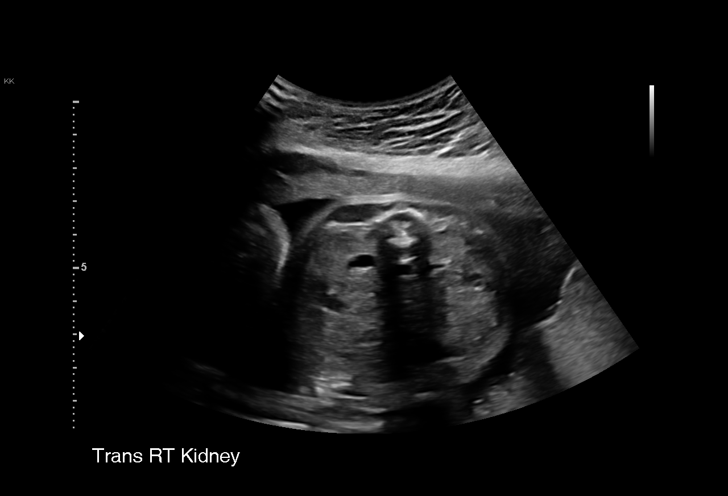
[im 17/41]
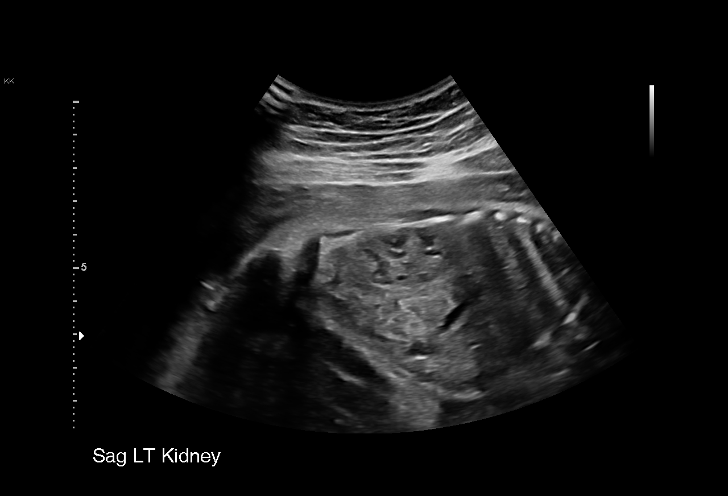
[im 21/41]
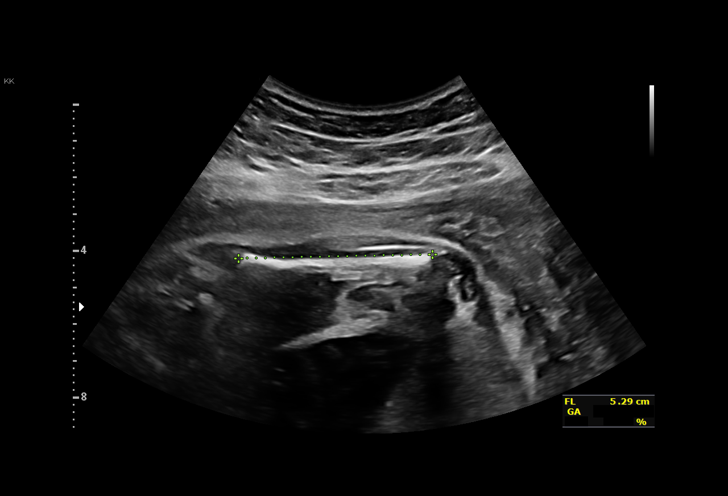
[im 24/41]
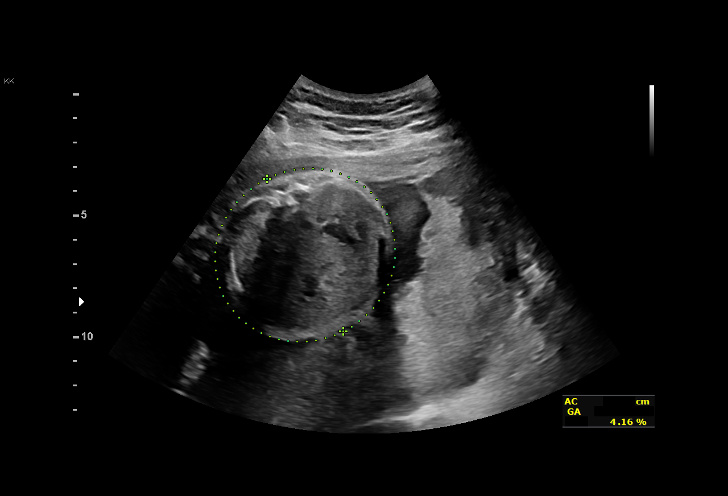
[im 27/41]
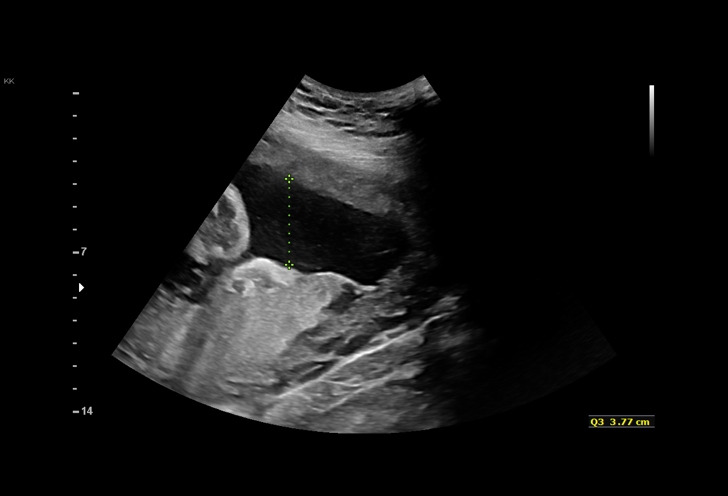
[im 30/41]
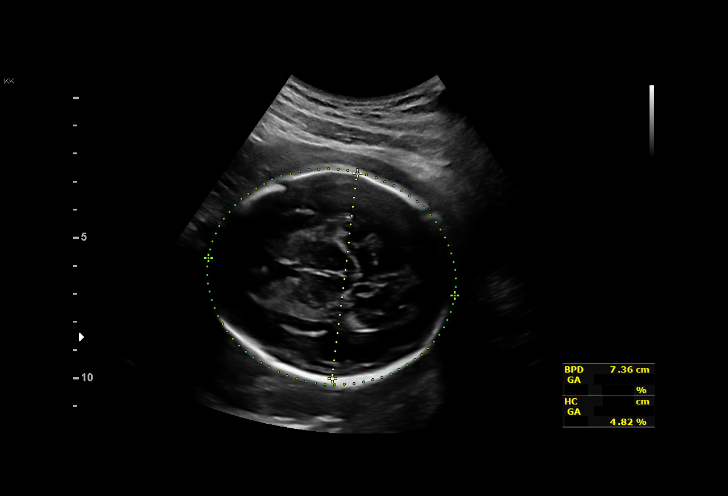
[im 33/41]
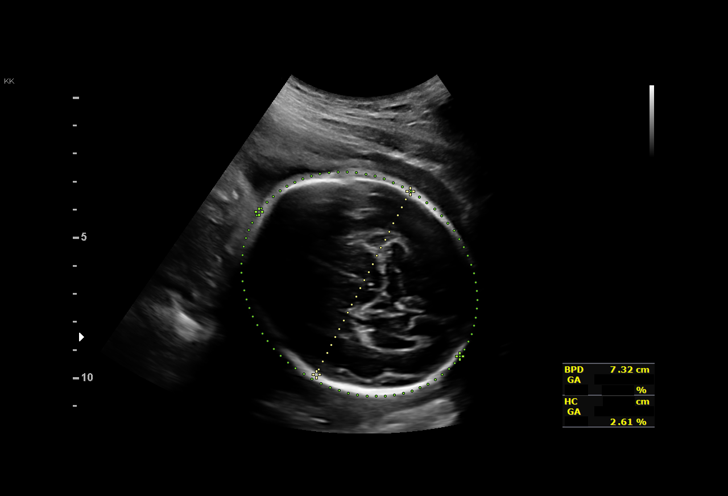
[im 36/41]
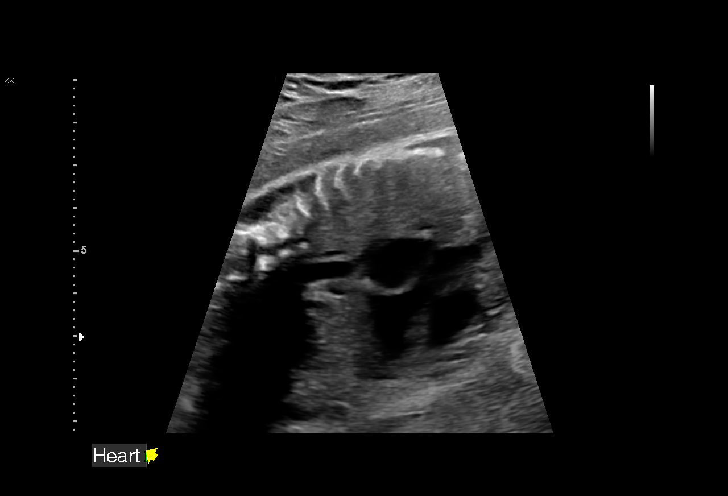
[im 39/41]
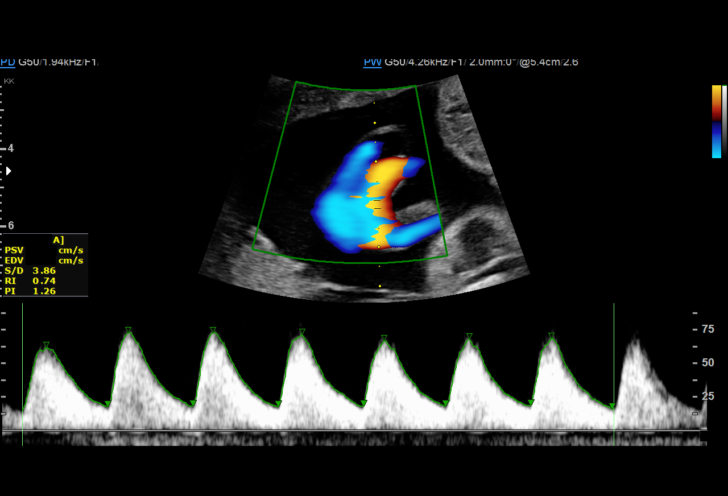

[13 of 28 positions shown; findings below may reference images not displayed]

Obstetrics &
                                                            Gynecology
                                                            [address]
                                                            [place].

  1  US MFM UA CORD DOPPLER               [address].02     BOHORA
                                                       FAXE
                                                       FAXE
 ----------------------------------------------------------------------

 ----------------------------------------------------------------------
Indications

  AFP negative, Low Risk NIPS
  Maternal care for known or suspected poor
  fetal growth, second trimester, not applicable
  or unspecified IUGR
  Encounter for other antenatal screening
  follow-up
  29 weeks gestation of pregnancy
 ----------------------------------------------------------------------
Vital Signs

                                                Height:        5'3"
Fetal Evaluation

 Num Of Fetuses:         1
 Fetal Heart Rate(bpm):  161
 Cardiac Activity:       Observed
 Presentation:           Cephalic
 Placenta:               Posterior
 P. Cord Insertion:      Previously Visualized
 Amniotic Fluid
 AFI FV:      Within normal limits

 AFI Sum(cm)     %Tile       Largest Pocket(cm)
 11.06           21

 RUQ(cm)       RLQ(cm)       LUQ(cm)        LLQ(cm)

Biometry

 BPD:        74  mm     G. Age:  29w 5d         55  %    CI:        81.86   %    70 - 86
                                                         FL/HC:      20.4   %    19.6 -
 HC:      258.1  mm     G. Age:  28w 0d          3  %    HC/AC:      1.13        0.99 -
 AC:      228.6  mm     G. Age:  27w 2d          4  %    FL/BPD:     71.2   %    71 - 87
 FL:       52.7  mm     G. Age:  28w 0d         10  %    FL/AC:      23.1   %    20 - 24

 Est. FW:    2207  gm      2 lb 8 oz      6  %
OB History

 Gravidity:    2         Term:   0        Prem:   0        SAB:   1
 TOP:          0       Ectopic:  0        Living: 0
Gestational Age

 LMP:           29w 1d        Date:  07/24/18                 EDD:   04/30/19
 U/S Today:     28w 2d                                        EDD:   05/06/19
 Best:          29w 1d     Det. By:  LMP  (07/24/18)          EDD:   04/30/19
Anatomy

 Cranium:               Appears normal         LVOT:                   Previously seen
 Cavum:                 Previously seen        Aortic Arch:            Previously seen
 Ventricles:            Appears normal         Ductal Arch:            Appears normal
 Choroid Plexus:        Previously seen        Diaphragm:              Previously seen
 Cerebellum:            Previously seen        Stomach:                Appears normal, left
                                                                       sided
 Posterior Fossa:       Previously seen        Abdomen:                Appears normal
 Nuchal Fold:           Not applicable (>20    Abdominal Wall:         Previously seen
                        wks GA)
 Face:                  Orbits and profile     Cord Vessels:           Previously seen
                        previously seen
 Lips:                  Previously seen        Kidneys:                Appear normal
 Palate:                Previously seen        Bladder:                Appears normal
 Thoracic:              Appears normal         Spine:                  Previously seen
 Heart:                 Previously seen        Upper Extremities:      Previously seen
 RVOT:                  Previously seen        Lower Extremities:      Previously seen

 Other:  Female gender Heels previously  visualized.
Doppler - Fetal Vessels

 Umbilical Artery
  S/D     %tile     RI              PI                     ADFV    RDFV
 3.43       76   0.71             1.16                        No      No

Cervix Uterus Adnexa
 Cervix
 Not visualized (advanced GA >04wks)
Impression

 IUGR again seen today.
 Good fetal movement and amniotic fluid
 UA Dopplers are normal.
 Low risk NIPS
 Reactive NST
Recommendations

 Repeat growth in 3 weeks
 Continue weekly UA Dopplers
 NST today

## 2021-02-21 IMAGING — US US MFM FETAL BPP W/O NON-STRESS
1 series · 13 of 28 positions shown · non-contrast
Comparison: none

[Series 1: us mfm fetal bpp w/o non-stress · 33 acquisitions, 13 frames shown]
[im 2/33]
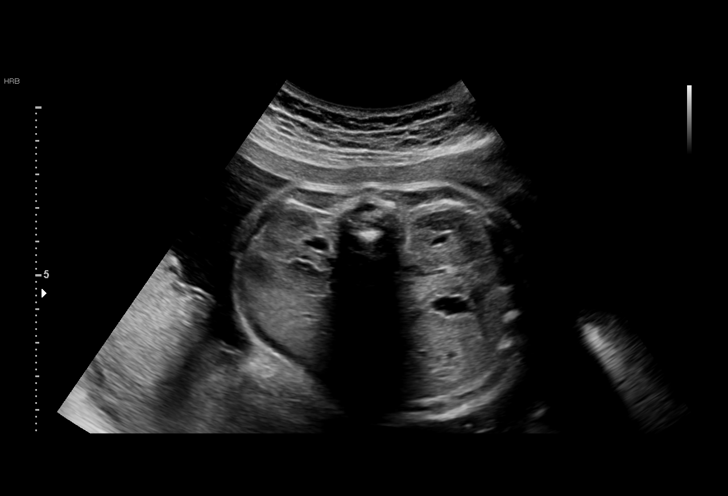
[im 4/33]
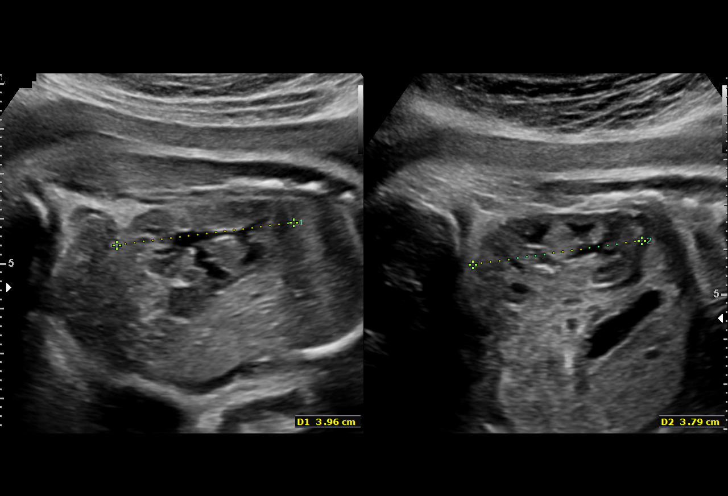
[im 6/33]
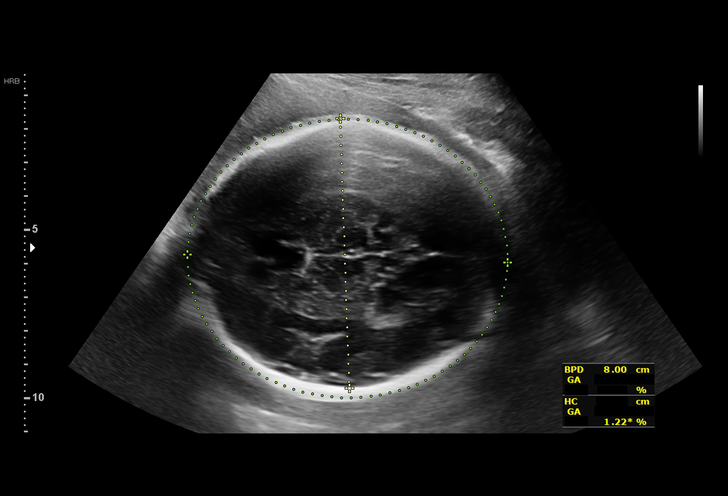
[im 9/33]
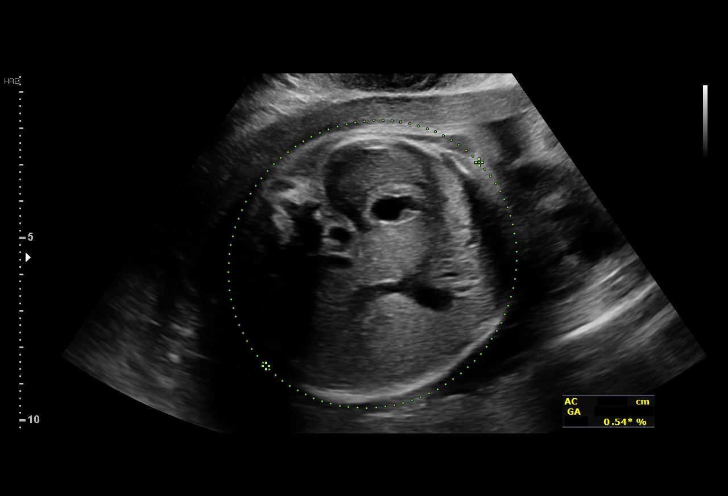
[im 11/33]
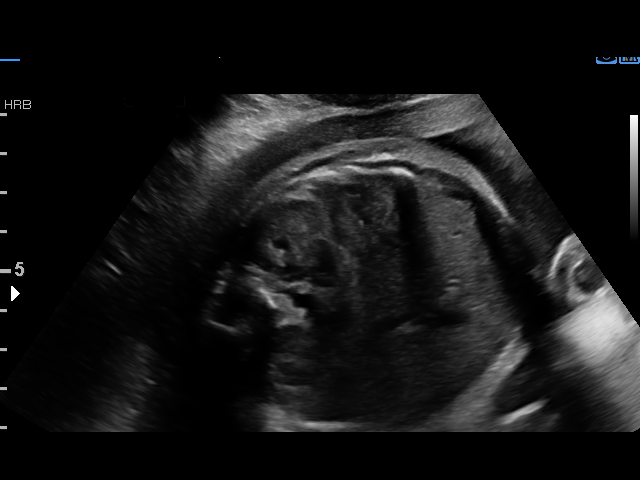
[im 14/33]
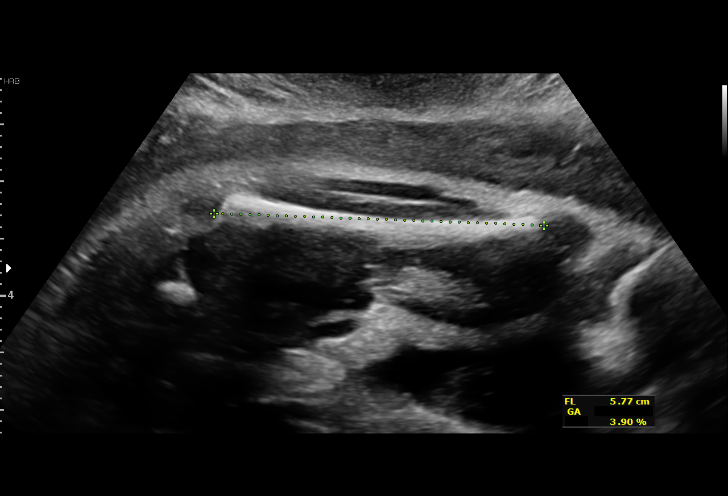
[im 17/33]
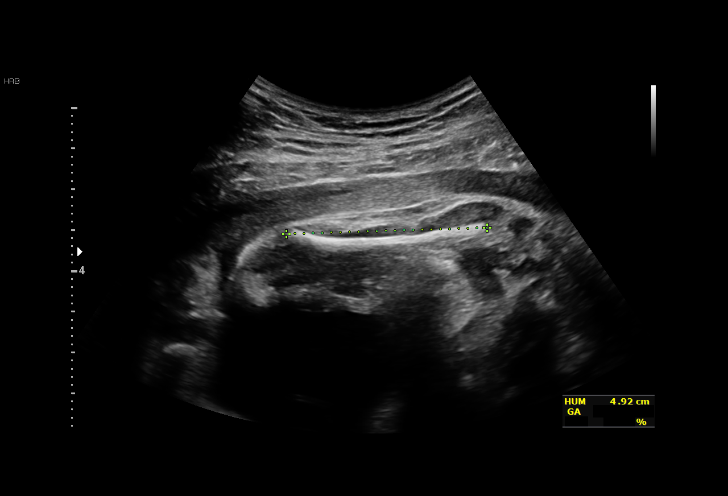
[im 19/33]
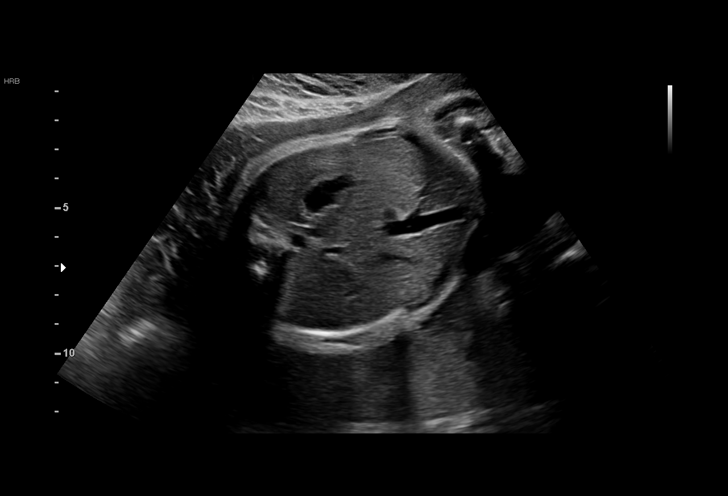
[im 22/33]
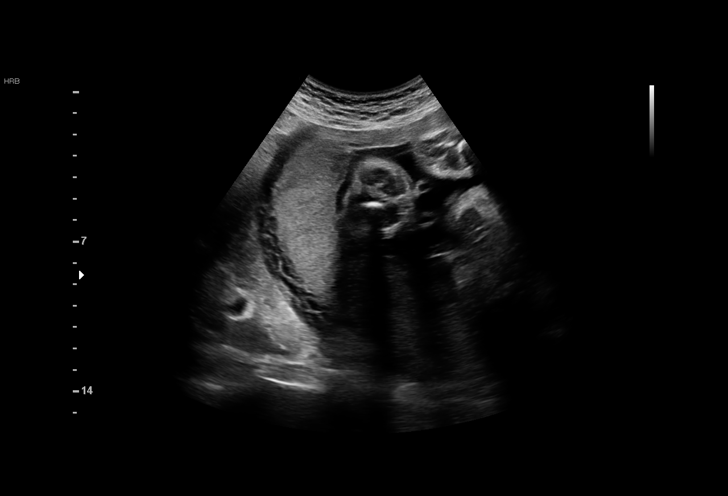
[im 24/33]
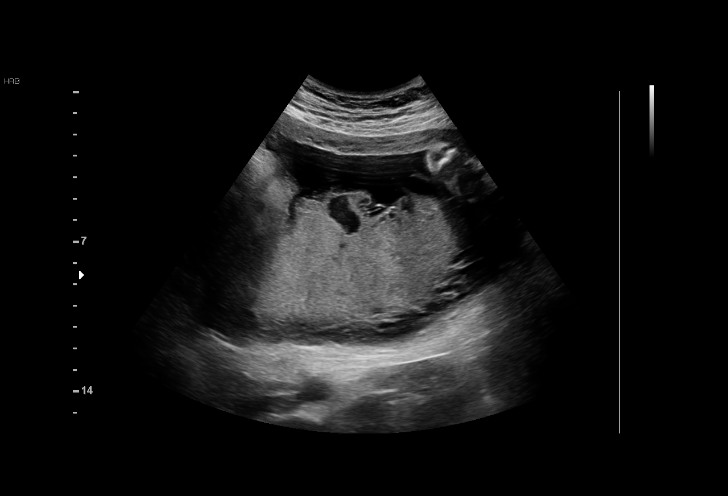
[im 27/33]
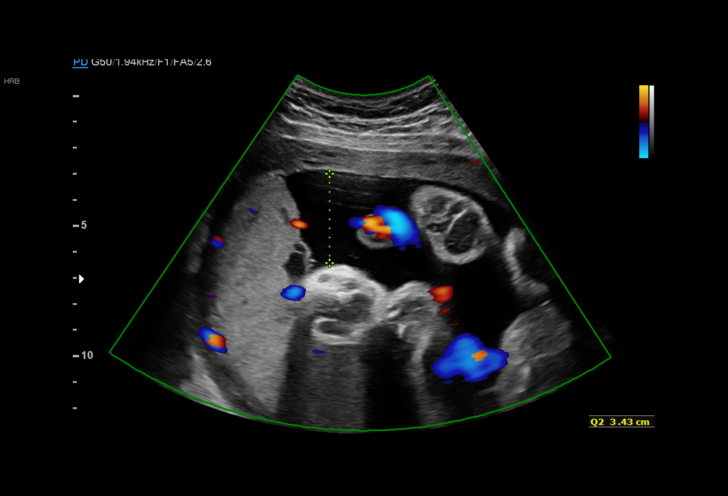
[im 29/33]
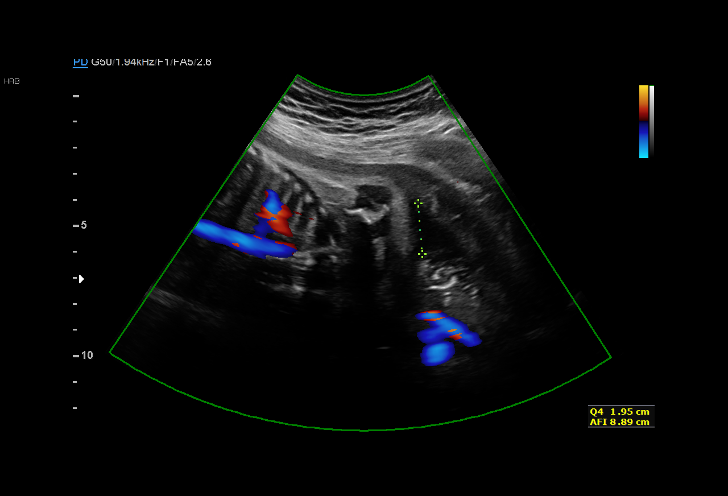
[im 31/33]
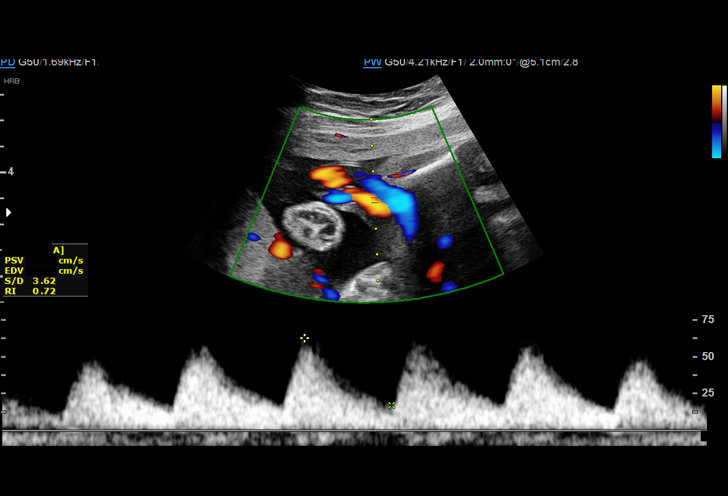

[13 of 28 positions shown; findings below may reference images not displayed]

Obstetrics &
                                                            Gynecology
                                                            [address]
                                                            [place].

     STRESS                                            LAMBA
  2  US MFM UA CORD DOPPLER               [address].02     BASALDUA
                                                       LAMBA
                                                       LAMBA
 ----------------------------------------------------------------------

 ----------------------------------------------------------------------
Indications

  Maternal care for known or suspected poor
  fetal growth, third trimester, fetus 2 IUGR
  Encounter for other antenatal screening
  follow-up
  AFP negative, Low Risk NIPS
  32 weeks gestation of pregnancy
 ----------------------------------------------------------------------
Vital Signs

 BMI:
Fetal Evaluation

 Num Of Fetuses:         1
 Fetal Heart Rate(bpm):  164
 Cardiac Activity:       Observed
 Presentation:           Cephalic
 Placenta:               Posterior
 P. Cord Insertion:      Previously Visualized
 Amniotic Fluid
 AFI FV:      Within normal limits

 AFI Sum(cm)     %Tile       Largest Pocket(cm)
 8.89            8

 RUQ(cm)       RLQ(cm)       LUQ(cm)        LLQ(cm)
 0
Biophysical Evaluation

 Amniotic F.V:   Within normal limits       F. Tone:        Observed
 F. Movement:    Observed                   Score:          [DATE]
 F. Breathing:   Observed
Biometry

 BPD:      79.8  mm     G. Age:  32w 0d         38  %    CI:        80.98   %    70 - 86
                                                         FL/HC:      20.7   %    19.1 -
 HC:       280   mm     G. Age:  30w 5d          1  %    HC/AC:      1.13        0.96 -
 AC:      247.3  mm     G. Age:  29w 0d        < 1  %    FL/BPD:     72.6   %    71 - 87
 FL:       57.9  mm     G. Age:  30w 2d          4  %    FL/AC:      23.4   %    20 - 24
 HUM:        49  mm     G. Age:  28w 6d        < 5  %

 Est. FW:    9080  gm      3 lb 3 oz      2  %
OB History

 Gravidity:    2         Term:   0        Prem:   0        SAB:   1
 TOP:          0       Ectopic:  0        Living: 0
Gestational Age

 LMP:           32w 1d        Date:  07/24/18                 EDD:   04/30/19
 U/S Today:     30w 4d                                        EDD:   05/11/19
 Best:          32w 1d     Det. By:  LMP  (07/24/18)          EDD:   04/30/19
Anatomy

 Cranium:               Appears normal         LVOT:                   Previously seen
 Cavum:                 Previously seen        Aortic Arch:            Previously seen
 Ventricles:            Appears normal         Ductal Arch:            Previously seen
 Choroid Plexus:        Previously seen        Diaphragm:              Previously seen
 Cerebellum:            Previously seen        Stomach:                Appears normal, left
                                                                       sided
 Posterior Fossa:       Previously seen        Abdomen:                Appears normal
 Nuchal Fold:           Not applicable (>20    Abdominal Wall:         Previously seen
                        wks GA)
 Face:                  Orbits and profile     Cord Vessels:           Previously seen
                        previously seen
 Lips:                  Previously seen        Kidneys:                Appear normal
 Palate:                Previously seen        Bladder:                Appears normal
 Thoracic:              Appears normal         Spine:                  Previously seen
 Heart:                 Previously seen        Upper Extremities:      Previously seen
 RVOT:                  Previously seen        Lower Extremities:      Previously seen

 Other:  Female gender Heels previously  visualized.
Doppler - Fetal Vessels
 Umbilical Artery
  S/D     %tile
 3.31       82

Cervix Uterus Adnexa

 Cervix
 Not visualized (advanced GA >39wks)
Impression

 Known Intrauterine growth restriction.  EFW 2%, with normal
 UA Dopplers
 Biophysical profile [DATE]
 Good fetal movement and amniotic fluid.
 Discussed Vinay Mercado
 No further questions.
Recommendations

 Continue weekly testing with UA Dopplers
 Repeat growth in 3 weeks.
 Consider delivery by 37 weeks given EFW <3%.

## 2021-03-07 IMAGING — US US MFM FETAL BPP W/O NON-STRESS
1 series · 15 of 24 positions shown · non-contrast
Comparison: none

[Series 1: us mfm fetal bpp w/o non-stress · 24 acquisitions, 15 frames shown]
[im 1/24]
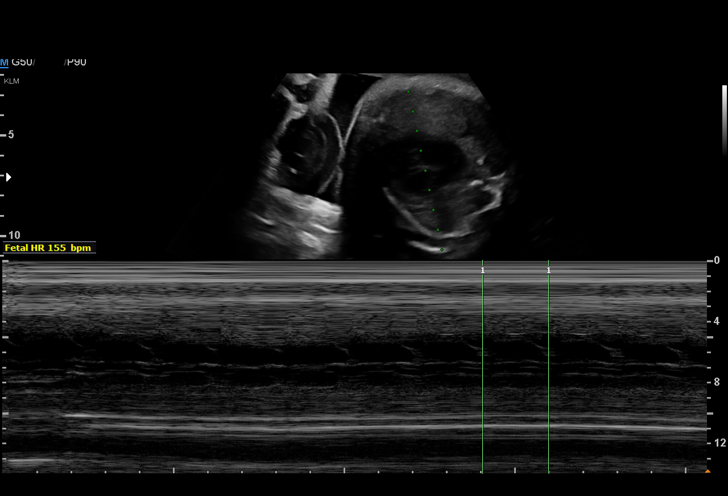
[im 3/24]
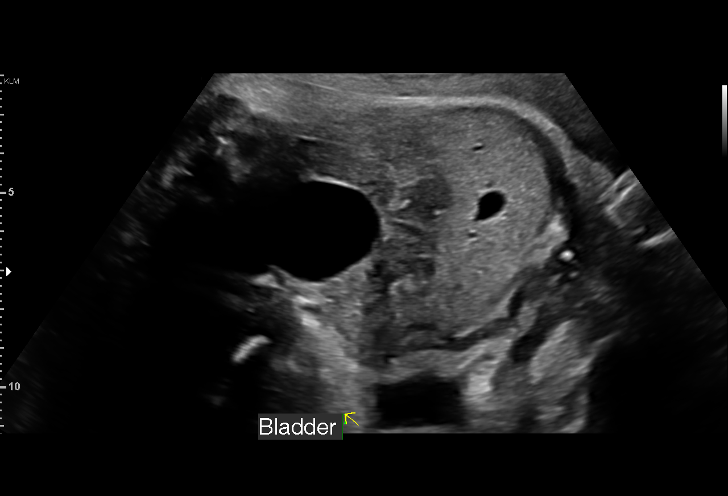
[im 5/24]
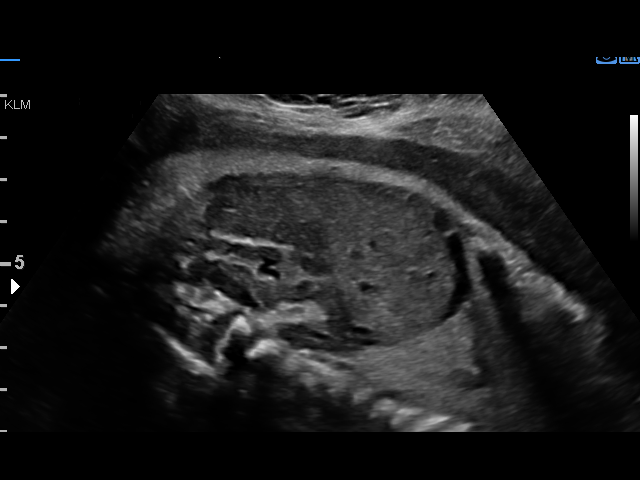
[im 6/24]
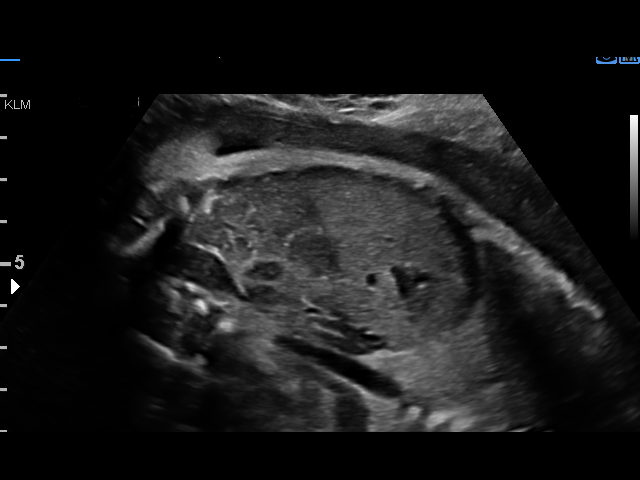
[im 8/24]
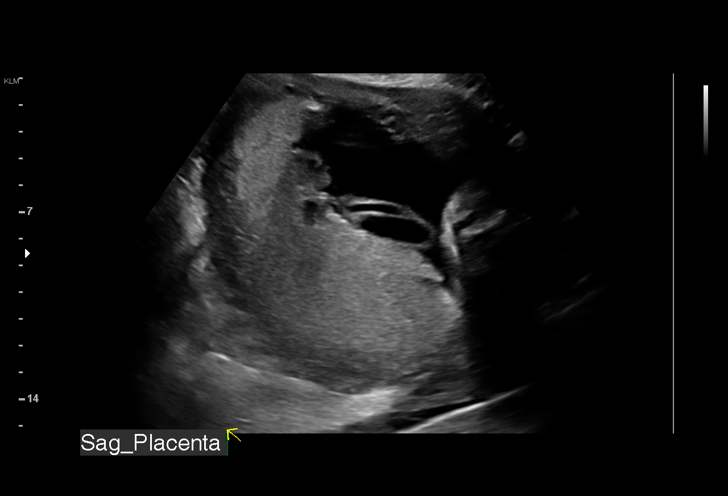
[im 9/24]
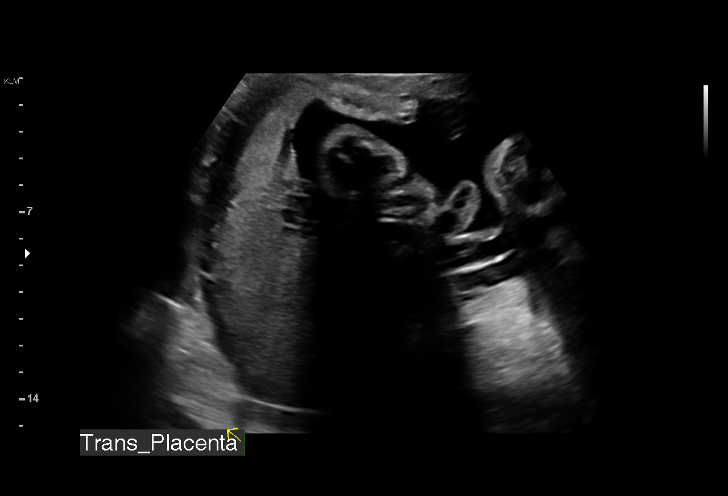
[im 11/24]
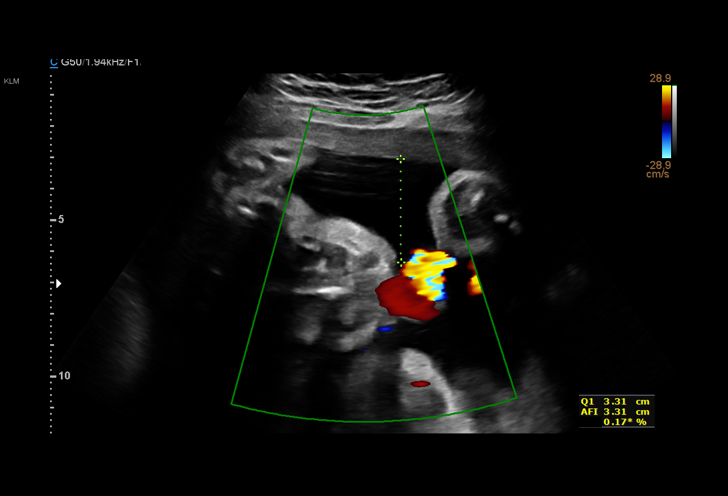
[im 13/24]
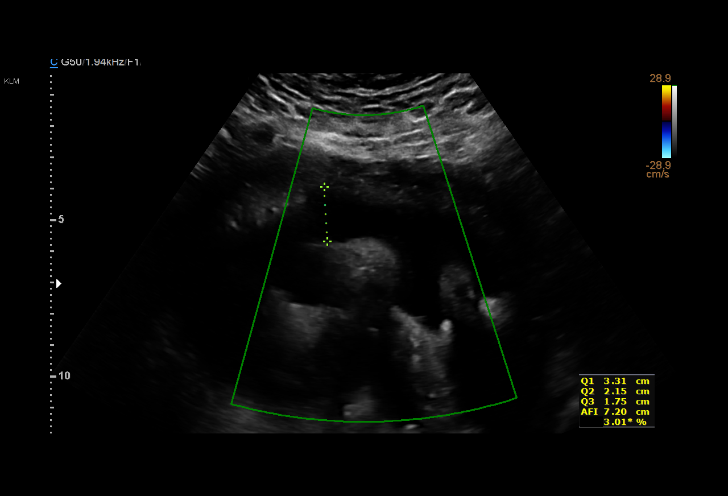
[im 14/24]
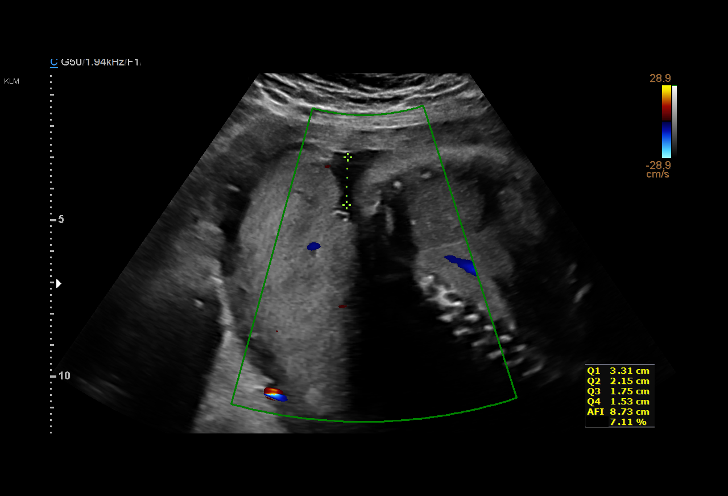
[im 16/24]
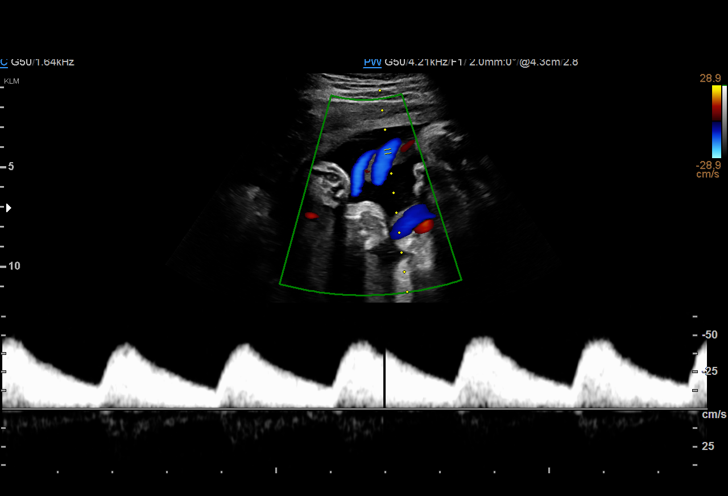
[im 17/24]
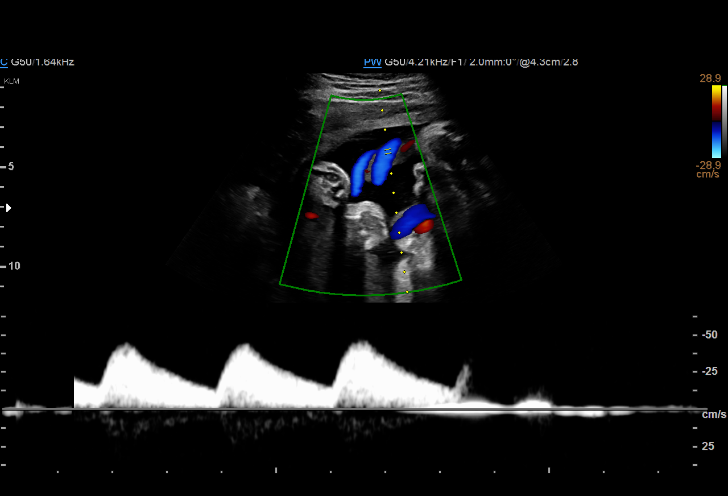
[im 19/24]
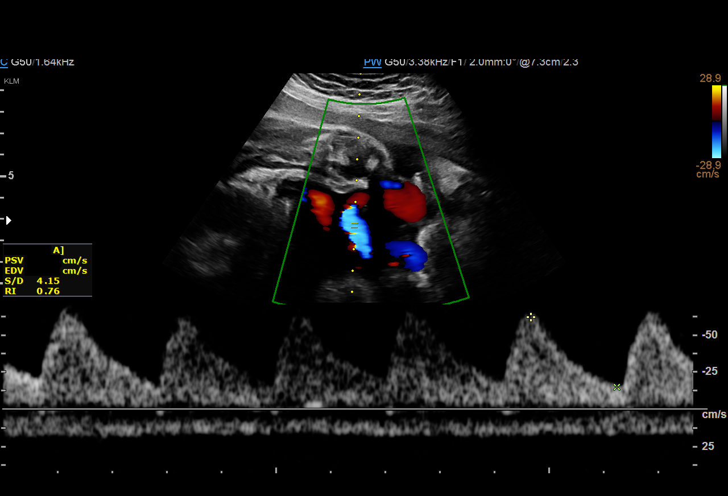
[im 21/24]
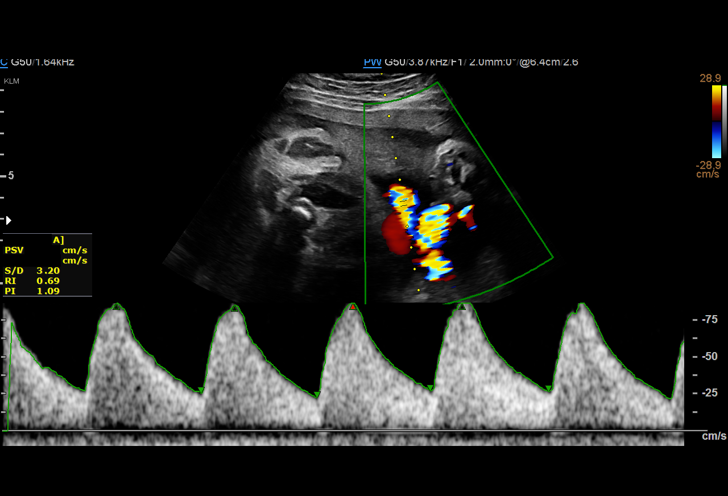
[im 22/24]
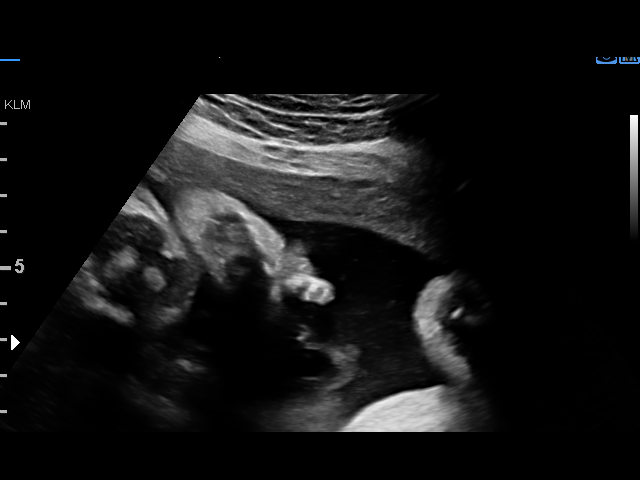
[im 24/24]
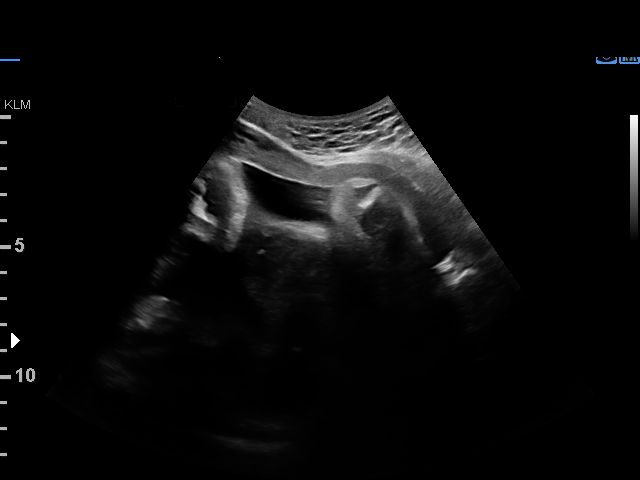

[15 of 24 positions shown; findings below may reference images not displayed]

Obstetrics &
                                                            Gynecology
                                                            [address]
                                                            [place].

     STRESS                                            [place]
  2  US MFM UA CORD DOPPLER               [address].02     JUMPER
                                                       [place]
 ----------------------------------------------------------------------

 ----------------------------------------------------------------------
Indications

  Maternal care for known or suspected poor
  fetal growth, first trimester not applicable or
  unspecified IUGR
  34 weeks gestation of pregnancy
  AFP negative, Low Risk NIPS
 ----------------------------------------------------------------------
Vital Signs

                                                Height:        5'3"
Fetal Evaluation

 Num Of Fetuses:         1
 Fetal Heart Rate(bpm):  155
 Cardiac Activity:       Observed
 Presentation:           Cephalic
 Placenta:               Posterior
 P. Cord Insertion:      Visualized

 Amniotic Fluid
 AFI FV:      Within normal limits

 AFI Sum(cm)     %Tile       Largest Pocket(cm)
 8.73            10
 RUQ(cm)       RLQ(cm)       LUQ(cm)        LLQ(cm)

Biophysical Evaluation

 Amniotic F.V:   Within normal limits       F. Tone:        Observed
 F. Movement:    Observed                   Score:          [DATE]
 F. Breathing:   Observed
OB History

 Gravidity:    2         Term:   0        Prem:   0        SAB:   1
 TOP:          0       Ectopic:  0        Living: 0
Gestational Age

 LMP:           34w 1d        Date:  07/24/18                 EDD:   04/30/19
 Best:          34w 1d     Det. By:  LMP  (07/24/18)          EDD:   04/30/19
Anatomy

 Stomach:               Appears normal, left   Bladder:                Appears normal
                        sided
Doppler - Fetal Vessels

 Umbilical Artery
  S/D     %tile                                            ADFV    RDFV
 3.41       91                                                No      No

Impression

 Known IUGR
 BPP [DATE]
 Good fetal movement and amniotic fluid
 Normal UA Dopplers
Recommendations

 Continue weekly testing and UA Dopplers.
 Consider delivery at 37 weeks given EFW a < 3%

## 2021-03-14 IMAGING — US US MFM FETAL BPP W/O NON-STRESS
1 series · 13 of 28 positions shown · non-contrast
Comparison: none

[Series 1: us mfm fetal bpp w/o non-stress · 39 acquisitions, 13 frames shown]
[im 2/39]
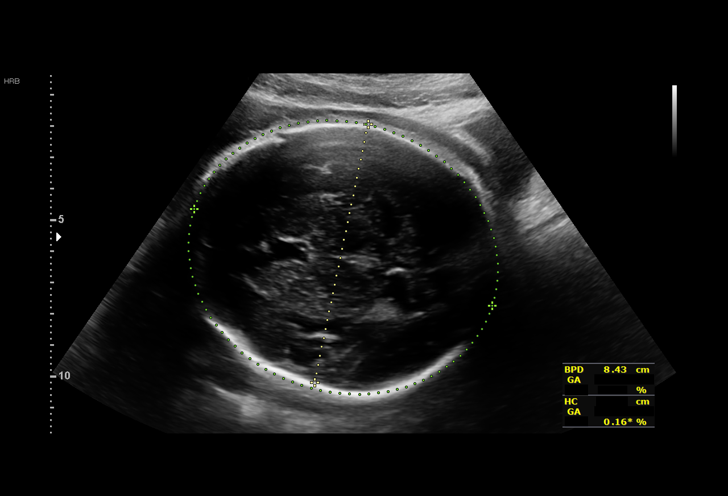
[im 5/39]
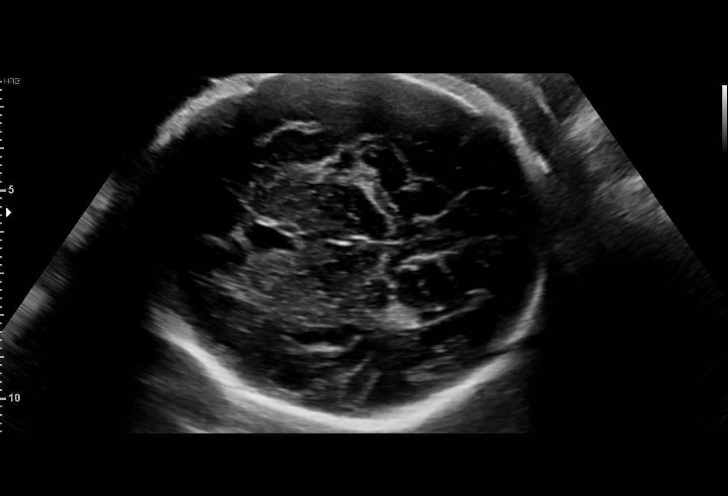
[im 8/39]
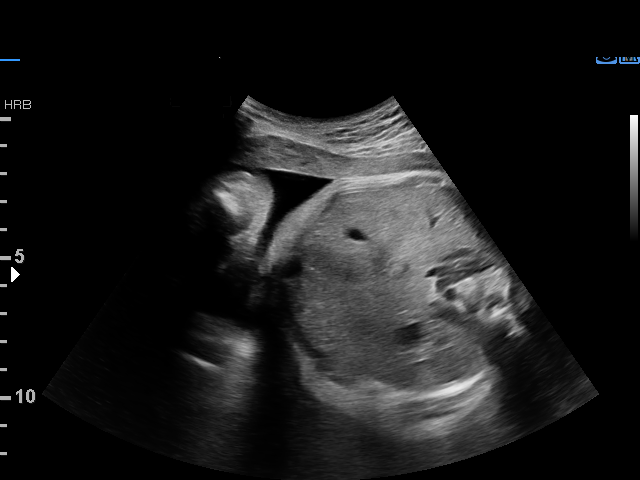
[im 10/39]
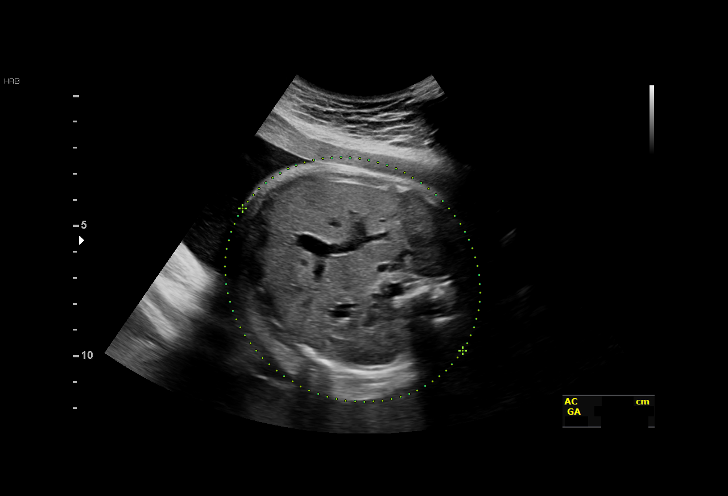
[im 13/39]
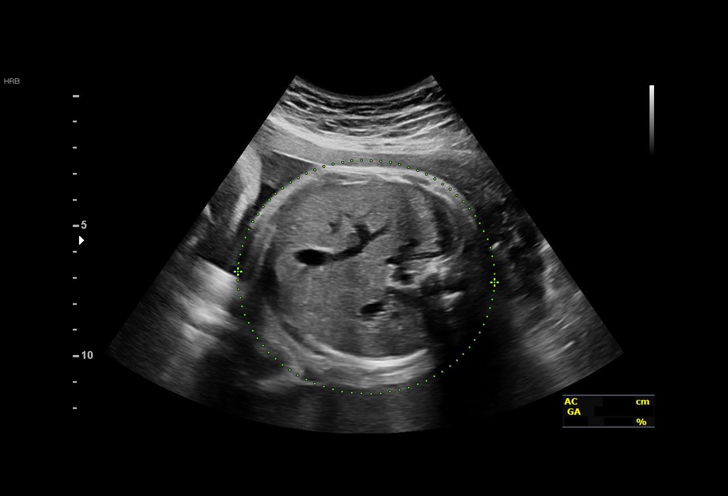
[im 16/39]
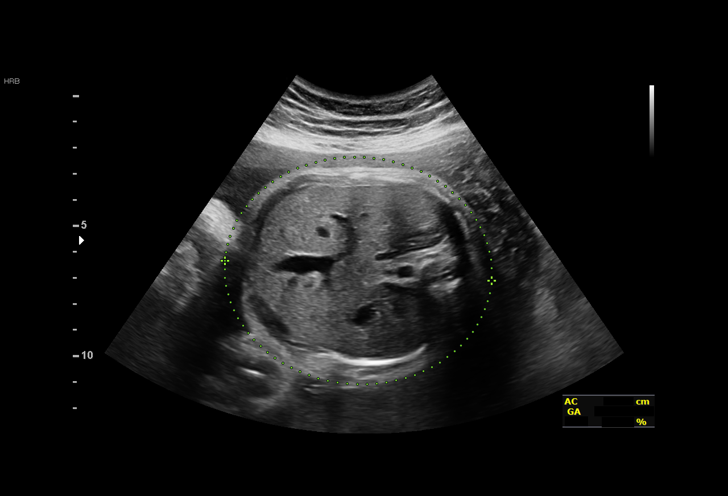
[im 20/39]
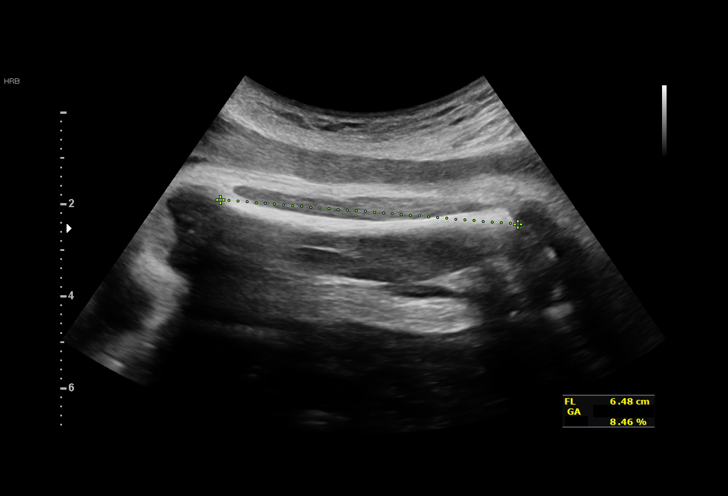
[im 23/39]
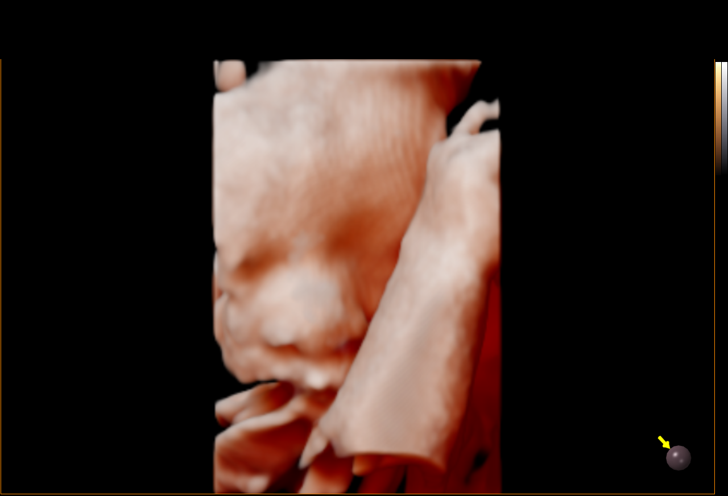
[im 26/39]
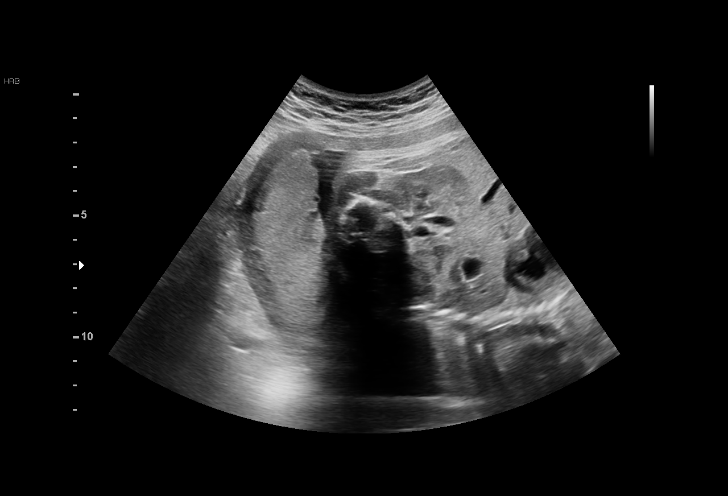
[im 29/39]
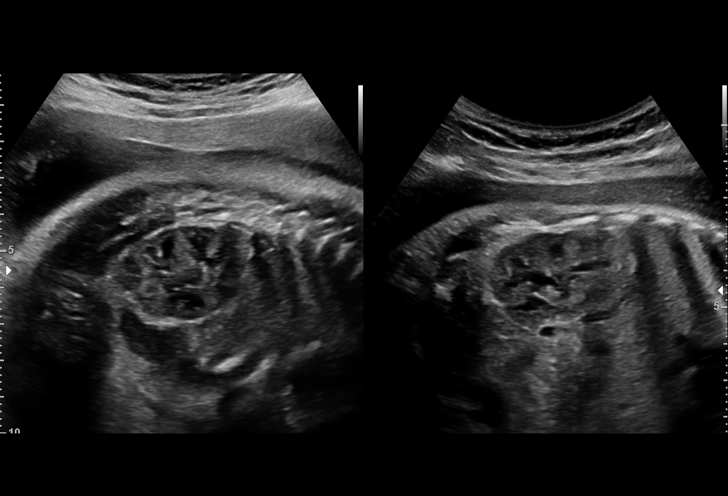
[im 31/39]
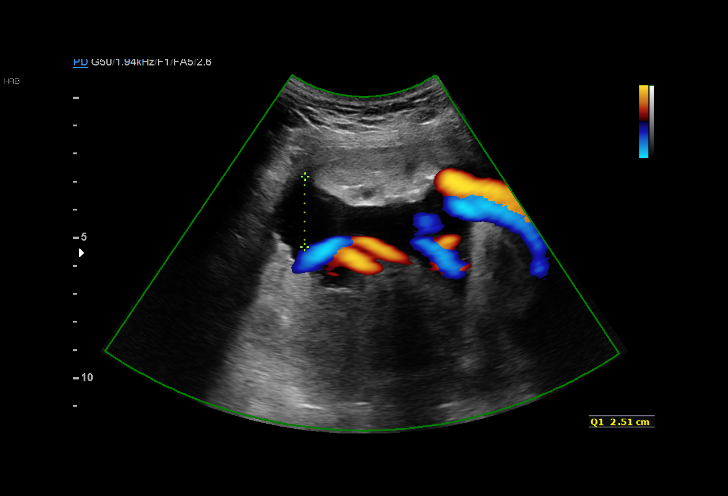
[im 34/39]
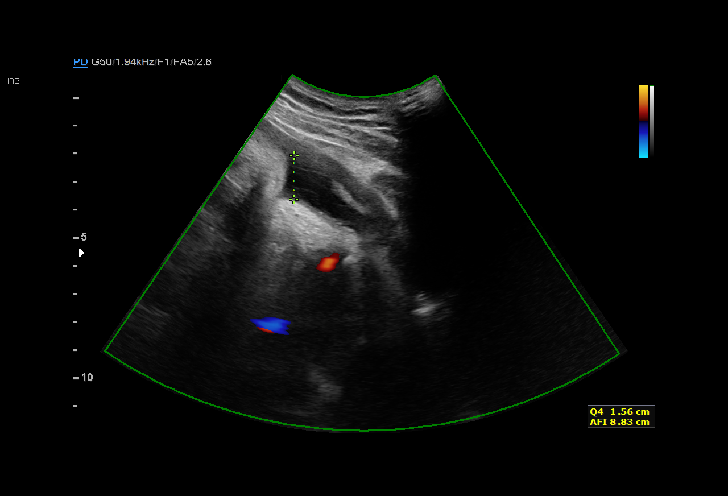
[im 37/39]
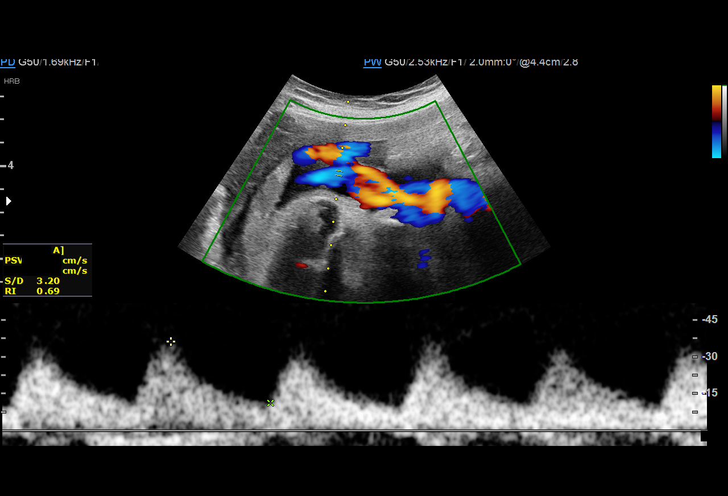

[13 of 28 positions shown; findings below may reference images not displayed]

Obstetrics &
                                                            Gynecology
                                                            [address]
                                                            [place].

     STRESS                                            BROKAYSIS
  2  US MFM UA CORD DOPPLER               76820.02     ANGELO MIGUEL
                                                       BROKAYSIS
                                                       BROKAYSIS
 ----------------------------------------------------------------------

 ----------------------------------------------------------------------
Indications

  Maternal care for known or suspected poor
  fetal growth, first trimester not applicable or
  unspecified IUGR
  AFP negative, Low Risk NIPS
  35 weeks gestation of pregnancy
  Encounter for other antenatal screening
  follow-up
 ----------------------------------------------------------------------
Vital Signs

                                                Height:        5'3"
Fetal Evaluation

 Num Of Fetuses:         1
 Fetal Heart Rate(bpm):  159
 Cardiac Activity:       Observed
 Presentation:           Cephalic
 Placenta:               Posterior
 P. Cord Insertion:      Previously Visualized
 Amniotic Fluid
 AFI FV:      Within normal limits

 AFI Sum(cm)     %Tile       Largest Pocket(cm)
 8.83            12

 RUQ(cm)       RLQ(cm)       LUQ(cm)        LLQ(cm)

Biometry

 BPD:      84.6  mm     G. Age:  34w 1d         23  %    CI:         82.2   %    70 - 86
                                                         FL/HC:      22.0   %    20.1 -
 HC:      294.4  mm     G. Age:  32w 4d        < 1  %    HC/AC:      0.99        0.93 -
 AC:      298.7  mm     G. Age:  33w 6d         21  %    FL/BPD:     76.7   %    71 - 87
 FL:       64.9  mm     G. Age:  33w 3d          9  %    FL/AC:      21.7   %    20 - 24
 LV:        2.6  mm

 Est. FW:    7779  gm    4 lb 15 oz      12  %
OB History

 Gravidity:    2         Term:   0        Prem:   0        SAB:   1
 TOP:          0       Ectopic:  0        Living: 0
Gestational Age

 LMP:           35w 1d        Date:  07/24/18                 EDD:   04/30/19
 U/S Today:     33w 4d                                        EDD:   05/11/19
 Best:          35w 1d     Det. By:  LMP  (07/24/18)          EDD:   04/30/19
Anatomy

 Cranium:               Appears normal         LVOT:                   Previously seen
 Cavum:                 Previously seen        Aortic Arch:            Previously seen
 Ventricles:            Appears normal         Ductal Arch:            Previously seen
 Choroid Plexus:        Previously seen        Diaphragm:              Previously seen
 Cerebellum:            Previously seen        Stomach:                Appears normal, left
                                                                       sided
 Posterior Fossa:       Previously seen        Abdomen:                Appears normal
 Nuchal Fold:           Not applicable (>20    Abdominal Wall:         Previously seen
                        wks GA)
 Face:                  Orbits and profile     Cord Vessels:           Previously seen
                        previously seen
 Lips:                  Previously seen        Kidneys:                Appear normal
 Palate:                Previously seen        Bladder:                Appears normal
 Thoracic:              Appears normal         Spine:                  Previously seen
 Heart:                 Previously seen        Upper Extremities:      Previously seen
 RVOT:                  Previously seen        Lower Extremities:      Previously seen

 Other:  Female gender Heels previously  visualized.
Doppler - Fetal Vessels

 Umbilical Artery
  S/D     %tile
  3.3       90
Cervix Uterus Adnexa

 Cervix
 Not visualized (advanced GA >27wks)
Impression

 Severe fetal growth restriction was diagnosed on previous
 scan.
 On ultrasound, the estimated fetal weight is at the 12th
 percentile.  Amniotic fluid is normal and good fetal activity is
 seen. Antenatal testing is reassuring. BPP [DATE].  Umbilical
 artery Doppler showed normal forward diastolic flow.

 Patient is scheduled for delivery at 37 weeks.
Recommendations

 -BPP, UA Doppler next week.
                 Moncayo, Thamer

## 2021-12-24 ENCOUNTER — Encounter (HOSPITAL_COMMUNITY): Payer: Self-pay | Admitting: Emergency Medicine

## 2021-12-24 ENCOUNTER — Ambulatory Visit (HOSPITAL_COMMUNITY)
Admission: EM | Admit: 2021-12-24 | Discharge: 2021-12-24 | Disposition: A | Discharge status: Home / Self Care | Payer: Medicaid Other | Attending: Internal Medicine | Admitting: Internal Medicine

## 2021-12-24 DIAGNOSIS — J029 Acute pharyngitis, unspecified: Secondary | ICD-10-CM | POA: Diagnosis not present

## 2021-12-24 DIAGNOSIS — Z20822 Contact with and (suspected) exposure to covid-19: Secondary | ICD-10-CM | POA: Insufficient documentation

## 2021-12-24 LAB — POCT INFECTIOUS MONO SCREEN, ED / UC: Mono Screen: NEGATIVE

## 2021-12-24 LAB — POCT RAPID STREP A, ED / UC: Streptococcus, Group A Screen (Direct): NEGATIVE

## 2021-12-24 MED ORDER — IBUPROFEN 800 MG PO TABS
800.0000 mg | ORAL_TABLET | Freq: Once | ORAL | Status: AC
  Administered 2021-12-24: 800 mg via ORAL

## 2021-12-24 MED ORDER — DIPHENHYDRAMINE HCL 12.5 MG/5ML PO LIQD: 5.0000 mL | Freq: Four times a day (QID) | ORAL | 0 refills | Status: DC | PRN

## 2021-12-24 MED ORDER — PHENOL 1.4 % MT LIQD: 1.0000 | OROMUCOSAL | 0 refills | Status: DC | PRN

## 2021-12-24 MED ORDER — LIDOCAINE VISCOUS HCL 2 % MT SOLN
15.0000 mL | Freq: Once | OROMUCOSAL | Status: AC
  Administered 2021-12-24: 15 mL via OROMUCOSAL

## 2021-12-24 MED ORDER — IBUPROFEN 800 MG PO TABS
ORAL_TABLET | ORAL | Status: AC
  Filled 2021-12-24: qty 1

## 2021-12-24 MED ORDER — BENZONATATE 100 MG PO CAPS: 100.0000 mg | ORAL_CAPSULE | Freq: Three times a day (TID) | ORAL | 0 refills | Status: DC | PRN

## 2021-12-24 MED ORDER — LIDOCAINE VISCOUS HCL 2 % MT SOLN
OROMUCOSAL | Status: AC
  Filled 2021-12-24: qty 15

## 2021-12-24 NOTE — Discharge Instructions (Addendum)
Please maintain adequate hydration Use Chloraseptic throat spray as needed for pain Take medications as prescribed Your strep test is negative.  We have sent off a throat culture We will call you with recommendations if labs are abnormal

## 2021-12-24 NOTE — ED Triage Notes (Signed)
Pt reports a sore throat x 1 week and shortness of breath since last night. States she went to the dentist yesterday and was told she had white spots on her tonsils.  Also endorses generalized body aches mainly from the abdominal area and up.

## 2021-12-24 NOTE — ED Provider Notes (Addendum)
[facility]       CSN: [id number] Arrival date & time: 12/24/21  9379      History   Chief Complaint Chief Complaint  Patient presents with   Sore Throat   Shortness of Breath    HPI Teresa Velez is a 31 y.o. female comes to urgent care with a 1 week history of sore throat, shortness of breath and pain on swallowing.  Patient's symptoms have been persistent.  She denies any sick contacts.  No sputum production or coughing.  She endorses generalized body aches and some abdominal pain.  She is concerned that she has white spots on her tonsils.  She was seen by her dentist yesterday and they recommended she comes to urgent care for further evaluation.  No dizziness, near syncope or syncopal episodes.  Patient smokes cigarettes.  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 Authorizing Provider  benzonatate (TESSALON) 100 MG capsule Take 1 capsule (100 mg total) by mouth 3 (three) times daily as needed for cough. 12/24/21  Yes Bert Ptacek, Britta Mccreedy, MD  magic mouthwash (lidocaine, diphenhydrAMINE, alum & mag hydroxide) suspension Swish and swallow 5 mLs 4 (four) times daily as needed  for mouth pain. Compound formulation: Viscous lidocaine 2% - 80 mL, Benadryl 12.5 mg/ml-80 mL, Maalox 80 mL 12/24/21  Yes Shaivi Rothschild, Britta Mccreedy, MD  phenol (CHLORASEPTIC) 1.4 % LIQD Use as directed 1 spray in the mouth or throat as needed for throat irritation / pain. 12/24/21  Yes Kapil Petropoulos, Britta Mccreedy, MD  medroxyPROGESTERone (PROVERA) 10 MG tablet Take 1 tablet (10 mg total) by mouth daily. 04/28/20   Elvina Sidle, MD    Family History Family History  Problem Relation Age of Onset   Hypertension Maternal Grandmother    Healthy Mother    Healthy Father     Social History Social History   Tobacco Use   Smoking status: Never   Smokeless tobacco: Never  Vaping Use   Vaping Use: Never used  Substance Use Topics   Alcohol use: Yes   Drug use: Not Currently    Types: Marijuana     Allergies   Metronidazole, Amoxicillin, and Penicillins   Review of Systems Review of Systems  Constitutional:  Positive for chills. Negative for activity change and fever.  HENT: Negative.    Respiratory:  Positive for cough and shortness of breath. Negative for wheezing.  Cardiovascular: Negative.   Gastrointestinal: Negative.   Musculoskeletal:  Positive for myalgias. Negative for gait problem and joint swelling.     Physical Exam Triage Vital Signs ED Triage Vitals  Enc Vitals Group     BP 12/24/21 0904 123/81     Pulse Rate 12/24/21 0904 (!) 102     Resp 12/24/21 0904 17     Temp 12/24/21 0904 98.6 F (37 C)     Temp Source 12/24/21 0904 Oral     SpO2 12/24/21 0904 99 %     Weight --      Height --      Head Circumference --      Peak Flow --      Pain Score 12/24/21 0902 9     Pain Loc --      Pain Edu? --      Excl. in GC? --    No data found.  Updated Vital Signs BP 123/81 (BP Location: Left Arm)   Pulse (!) 102   Temp 98.6 F (37 C) (Oral)   Resp 17   SpO2 99%   Visual Acuity Right Eye Distance:   Left Eye Distance:   Bilateral Distance:    Right Eye Near:    Left Eye Near:    Bilateral Near:     Physical Exam Vitals and nursing note reviewed.  Constitutional:      General: She is not in acute distress.    Appearance: She is not ill-appearing.  HENT:     Mouth/Throat:     Mouth: Mucous membranes are moist.     Pharynx: Uvula midline.     Tonsils: No tonsillar exudate or tonsillar abscesses.     Comments: Pharyngeal erythema.  Tonsils are 2+ with few white spots bilaterally.. Cardiovascular:     Rate and Rhythm: Normal rate and regular rhythm.  Pulmonary:     Effort: Pulmonary effort is normal.     Breath sounds: Normal breath sounds.  Neurological:     Mental Status: She is alert.      UC Treatments / Results  Labs (all labs ordered are listed, but only abnormal results are displayed) Labs Reviewed  CULTURE, GROUP A STREP ([facility])  SARS CORONAVIRUS 2 (TAT 6-24 HRS)  POCT RAPID STREP A, ED / UC  POCT INFECTIOUS MONO SCREEN, ED / UC    EKG   Radiology No results found.  Procedures Procedures (including critical care time)  Medications Ordered in UC Medications  ibuprofen (ADVIL) tablet 800 mg (800 mg Oral Given 12/24/21 1011)  lidocaine (XYLOCAINE) 2 % viscous mouth solution 15 mL (15 mLs Mouth/Throat Given 12/24/21 1010)    Initial Impression / Assessment and Plan / UC Course  I have reviewed the triage vital signs and the nursing notes.  Pertinent labs & imaging results that were available during my care of the patient were reviewed by me and considered in my medical decision making (see chart for details).     1.  Acute viral pharyngitis: Ibuprofen x1 dose given in the clinic Viscous lidocaine COVID 19 PCR test has been sent Point-of-care strep is negative Throat cultures has been sent Monoscreen is negative Maintain adequate hydration Patient will be called with recommendations if labs are abnormal Mouthwash for swish and swallow Tessalon Perles as needed for cough Return to urgent care if symptoms  worsen. Final Clinical Impressions(s) / UC Diagnoses   Final diagnoses:  Acute viral pharyngitis     Discharge Instructions  Please maintain adequate hydration Use Chloraseptic throat spray as needed for pain Take medications as prescribed Your strep test is negative.  We have sent off a throat culture We will call you with recommendations if labs are abnormal   ED Prescriptions     Medication Sig Dispense Auth. Provider   benzonatate (TESSALON) 100 MG capsule Take 1 capsule (100 mg total) by mouth 3 (three) times daily as needed for cough. 30 capsule Navada Osterhout, Britta Mccreedy, MD   phenol (CHLORASEPTIC) 1.4 % LIQD Use as directed 1 spray in the mouth or throat as needed for throat irritation / pain. 118 mL Cyndy Braver, Britta Mccreedy, MD   magic mouthwash (lidocaine, diphenhydrAMINE, alum & mag hydroxide) suspension Swish and swallow 5 mLs 4 (four) times daily as needed for mouth pain. Compound formulation: Viscous lidocaine 2% - 80 mL, Benadryl 12.5 mg/ml-80 mL, Maalox 80 mL 240 mL Katiejo Gilroy, Britta Mccreedy, MD      PDMP not reviewed this encounter.   Merrilee Jansky, MD 12/24/21 1210    Merrilee Jansky, MD 12/24/21 1210

## 2021-12-25 LAB — CULTURE, GROUP A STREP (THRC)

## 2021-12-25 LAB — SARS CORONAVIRUS 2 (TAT 6-24 HRS): SARS Coronavirus 2: NEGATIVE

## 2022-03-14 ENCOUNTER — Ambulatory Visit (HOSPITAL_COMMUNITY)
Admission: EM | Admit: 2022-03-14 | Discharge: 2022-03-14 | Disposition: A | Discharge status: Home / Self Care | Payer: Medicaid Other | Attending: Physician Assistant | Admitting: Physician Assistant

## 2022-03-14 ENCOUNTER — Encounter (HOSPITAL_COMMUNITY): Payer: Self-pay

## 2022-03-14 DIAGNOSIS — R051 Acute cough: Secondary | ICD-10-CM

## 2022-03-14 DIAGNOSIS — J029 Acute pharyngitis, unspecified: Secondary | ICD-10-CM | POA: Diagnosis not present

## 2022-03-14 DIAGNOSIS — Z3202 Encounter for pregnancy test, result negative: Secondary | ICD-10-CM | POA: Diagnosis not present

## 2022-03-14 DIAGNOSIS — M25512 Pain in left shoulder: Secondary | ICD-10-CM | POA: Diagnosis not present

## 2022-03-14 DIAGNOSIS — J069 Acute upper respiratory infection, unspecified: Secondary | ICD-10-CM

## 2022-03-14 DIAGNOSIS — S46912A Strain of unspecified muscle, fascia and tendon at shoulder and upper arm level, left arm, initial encounter: Secondary | ICD-10-CM

## 2022-03-14 LAB — POCT RAPID STREP A, ED / UC: Streptococcus, Group A Screen (Direct): NEGATIVE

## 2022-03-14 LAB — POC URINE PREG, ED: Preg Test, Ur: NEGATIVE

## 2022-03-14 MED ORDER — TIZANIDINE HCL 4 MG PO TABS: 4.0000 mg | ORAL_TABLET | Freq: Four times a day (QID) | ORAL | 0 refills | Status: DC | PRN

## 2022-03-14 MED ORDER — IBUPROFEN 600 MG PO TABS: 600.0000 mg | ORAL_TABLET | Freq: Four times a day (QID) | ORAL | 0 refills | Status: DC | PRN

## 2022-03-14 MED ORDER — DM-GUAIFENESIN ER 30-600 MG PO TB12: 1.0000 | ORAL_TABLET | Freq: Two times a day (BID) | ORAL | 0 refills | Status: DC
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
## 2022-03-14 NOTE — Discharge Instructions (Addendum)
Advised take the Mucinex DM every 12 hours to treat cough and congestion. Advised to take the ibuprofen 600 mg every 8 hours with food to treat the shoulder and neck pain and discomfort. Advised take the Zanaflex 4 mg every 6 hours to help reduce muscle spasm and irritability. Advised to follow-up PCP or return to urgent care if symptoms fail to improve.

## 2022-03-16 ENCOUNTER — Encounter (HOSPITAL_COMMUNITY): Payer: Self-pay | Admitting: *Deleted

## 2022-03-16 ENCOUNTER — Ambulatory Visit (HOSPITAL_COMMUNITY)
Admission: EM | Admit: 2022-03-16 | Discharge: 2022-03-16 | Disposition: A | Discharge status: Home / Self Care | Payer: Medicaid Other | Attending: Physician Assistant | Admitting: Physician Assistant

## 2022-03-16 DIAGNOSIS — M549 Dorsalgia, unspecified: Secondary | ICD-10-CM | POA: Diagnosis not present

## 2022-03-16 DIAGNOSIS — M25512 Pain in left shoulder: Secondary | ICD-10-CM

## 2022-03-16 DIAGNOSIS — Y92410 Unspecified street and highway as the place of occurrence of the external cause: Secondary | ICD-10-CM | POA: Insufficient documentation

## 2022-03-16 DIAGNOSIS — R490 Dysphonia: Secondary | ICD-10-CM | POA: Diagnosis not present

## 2022-03-16 DIAGNOSIS — Z1152 Encounter for screening for COVID-19: Secondary | ICD-10-CM | POA: Diagnosis not present
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
## 2022-03-17 LAB — SARS CORONAVIRUS 2 (TAT 6-24 HRS): SARS Coronavirus 2: NEGATIVE

## 2022-06-13 ENCOUNTER — Ambulatory Visit (HOSPITAL_COMMUNITY)
Admission: EM | Admit: 2022-06-13 | Discharge: 2022-06-13 | Disposition: A | Discharge status: Home / Self Care | Payer: 59 | Attending: Emergency Medicine | Admitting: Emergency Medicine

## 2022-06-13 ENCOUNTER — Encounter (HOSPITAL_COMMUNITY): Payer: Self-pay | Admitting: Emergency Medicine

## 2022-06-13 ENCOUNTER — Other Ambulatory Visit: Payer: Self-pay

## 2022-06-13 DIAGNOSIS — S46819A Strain of other muscles, fascia and tendons at shoulder and upper arm level, unspecified arm, initial encounter: Secondary | ICD-10-CM

## 2022-06-13 DIAGNOSIS — S46912A Strain of unspecified muscle, fascia and tendon at shoulder and upper arm level, left arm, initial encounter: Secondary | ICD-10-CM

## 2022-06-13 DIAGNOSIS — S46812A Strain of other muscles, fascia and tendons at shoulder and upper arm level, left arm, initial encounter: Secondary | ICD-10-CM

## 2022-06-13 DIAGNOSIS — S46811A Strain of other muscles, fascia and tendons at shoulder and upper arm level, right arm, initial encounter: Secondary | ICD-10-CM | POA: Diagnosis not present

## 2022-06-13 DIAGNOSIS — S39012A Strain of muscle, fascia and tendon of lower back, initial encounter: Secondary | ICD-10-CM | POA: Diagnosis not present

## 2022-06-13 DIAGNOSIS — S46911A Strain of unspecified muscle, fascia and tendon at shoulder and upper arm level, right arm, initial encounter: Secondary | ICD-10-CM

## 2022-06-13 LAB — POC URINE PREG, ED: Preg Test, Ur: NEGATIVE

## 2022-06-13 MED ORDER — TIZANIDINE HCL 4 MG PO TABS: 4.0000 mg | ORAL_TABLET | Freq: Three times a day (TID) | ORAL | 0 refills | Status: DC | PRN

## 2022-06-13 MED ORDER — NAPROXEN 500 MG PO TABS: 500.0000 mg | ORAL_TABLET | Freq: Two times a day (BID) | ORAL | 0 refills | Status: DC

## 2022-06-13 MED ORDER — METAXALONE 800 MG PO TABS: 800.0000 mg | ORAL_TABLET | Freq: Three times a day (TID) | ORAL | 0 refills | Status: DC
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
## 2022-06-13 NOTE — Discharge Instructions (Addendum)
Your urine pregnancy was negative.    People tend to feel worse over the next several days, but most people are back to normal in 1 week. A small number of people will have persistent pain for up to six weeks.  Take the Zanaflex or Skelaxin, whichever 1 is more affordable.  Skelaxin as slightly more effective than Zanaflex, but they both work well.  Take the Naprosyn with 1000 mg of Tylenol twice a day.  May take an additional 1000 mg of Tylenol per day.  Go to www.goodrx.com  or www.costplusdrugs.com to look up your medications. This will give you a list of where you can find your prescriptions at the most affordable prices. Or ask the pharmacist what the cash price is, or if they have any other discount programs available to help make your medication more affordable. This can be less expensive than what you would pay with insurance.

## 2022-06-16 ENCOUNTER — Encounter (HOSPITAL_COMMUNITY): Payer: Self-pay

## 2022-06-16 ENCOUNTER — Emergency Department (HOSPITAL_COMMUNITY)
Admission: EM | Admit: 2022-06-16 | Discharge: 2022-06-16 | Disposition: A | Discharge status: Home / Self Care | Payer: 59 | Attending: Emergency Medicine | Admitting: Emergency Medicine

## 2022-06-16 ENCOUNTER — Other Ambulatory Visit: Payer: Self-pay

## 2022-06-16 DIAGNOSIS — M546 Pain in thoracic spine: Secondary | ICD-10-CM | POA: Diagnosis present

## 2022-06-16 DIAGNOSIS — Y92410 Unspecified street and highway as the place of occurrence of the external cause: Secondary | ICD-10-CM | POA: Insufficient documentation

## 2022-06-16 DIAGNOSIS — M25512 Pain in left shoulder: Secondary | ICD-10-CM | POA: Insufficient documentation
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
## 2022-06-16 NOTE — Discharge Instructions (Signed)
Please continue alternating between Tylenol and ibuprofen as needed for pain.  You may also take denies any muscle relaxant as prescribed at night to help with rest as the medication may cause drowsiness.  You may also use over-the-counter IcyHot or Bengay cream for symptom control.  Return if you have any concern.
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
## 2022-09-13 ENCOUNTER — Telehealth: Payer: 59 | Admitting: Family Medicine

## 2022-09-13 DIAGNOSIS — N898 Other specified noninflammatory disorders of vagina: Secondary | ICD-10-CM

## 2022-09-13 DIAGNOSIS — R102 Pelvic and perineal pain: Secondary | ICD-10-CM

## 2022-09-13 NOTE — Progress Notes (Signed)
Because pain with intercourse and bowel movement with vaginal discharge needs to be seen in person for swab and testing, your condition warrants further evaluation and I recommend that you be seen in a face to face visit.   NOTE: There will be NO CHARGE for this eVisit

## 2022-09-19 ENCOUNTER — Encounter (HOSPITAL_COMMUNITY): Payer: Self-pay

## 2022-09-19 ENCOUNTER — Ambulatory Visit (HOSPITAL_COMMUNITY)
Admission: EM | Admit: 2022-09-19 | Discharge: 2022-09-19 | Disposition: A | Discharge status: Home / Self Care | Payer: Medicaid Other | Attending: Emergency Medicine | Admitting: Emergency Medicine

## 2022-09-19 DIAGNOSIS — Z113 Encounter for screening for infections with a predominantly sexual mode of transmission: Secondary | ICD-10-CM

## 2022-09-19 DIAGNOSIS — N898 Other specified noninflammatory disorders of vagina: Secondary | ICD-10-CM | POA: Diagnosis not present

## 2022-09-19 DIAGNOSIS — N926 Irregular menstruation, unspecified: Secondary | ICD-10-CM | POA: Diagnosis not present

## 2022-09-19 DIAGNOSIS — Z789 Other specified health status: Secondary | ICD-10-CM

## 2022-09-19 LAB — POCT URINALYSIS DIP (MANUAL ENTRY)
Bilirubin, UA: NEGATIVE
Blood, UA: NEGATIVE
Glucose, UA: NEGATIVE mg/dL
Leukocytes, UA: NEGATIVE
Nitrite, UA: NEGATIVE
Protein Ur, POC: NEGATIVE mg/dL
Spec Grav, UA: >=1.03 — AB (ref 1.010–1.025)
Urobilinogen, UA: 1 E.U./dL
pH, UA: 6 (ref 5.0–8.0)

## 2022-09-19 LAB — HIV ANTIBODY (ROUTINE TESTING W REFLEX): HIV Screen 4th Generation wRfx: NONREACTIVE

## 2022-09-19 LAB — POCT URINE PREGNANCY: Preg Test, Ur: NEGATIVE

## 2022-09-19 NOTE — ED Triage Notes (Signed)
Pt is here for lower abdominal pain x 2 weeks. Pt reports vaginal discharge x 2 weeks.  Pt is late coming on her cycle. Pt would like STI-testing with blood work.

## 2022-09-19 NOTE — Discharge Instructions (Signed)
The results of your vaginal swab test which screens for BV, yeast, gonorrhea, chlamydia and trichomonas will be made posted to your MyChart account once it is complete.  This typically takes 2 to 4 days.  Please abstain from sexual intercourse of any kind, vaginal, oral or anal, until you have received the results of your STD testing.     The results of your HIV and syphilis blood tests will be made available to you once they are complete.  They will initially be posted to your MyChart account which typically takes 2 to 3 days.     If any of your results are abnormal, you will receive a phone call regarding treatment.  Prescriptions, if any are needed, will be provided for you at your pharmacy.     Your urine pregnancy test today is negative.   Our point-of-care analysis of your urine sample today was normal and did not reveal any concern for urinary tract infection but you do appear to be mildly dehydrated.  Please be sure that you drink between 80 and 120 ounces of water every day.   If you have not had complete resolution of your symptoms after completing any recommended treatment or if your symptoms worsen, please return for repeat evaluation.   Thank you for visiting urgent care today.  I appreciate the opportunity to participate in your care.

## 2022-09-19 NOTE — ED Provider Notes (Signed)
[facility]    CSN: [id number] Arrival date & time: 09/19/22  1207    HISTORY  No chief complaint on file.  HPI Teresa Velez is a pleasant, 32 y.o. female who presents to urgent care today. Pt is here for lower abdominal pain x 2 weeks. Pt reports thin grayish green vaginal discharge x 2 weeks, not copious. Noticed fishy odor of discharge last week.  Pt is late coming on her cycle, UPT today is negative. Pt would like STI-testing with blood work. Denies known exposure to STD  The history is provided by the patient.   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 Authorizing Provider  medroxyPROGESTERone (PROVERA) 10 MG tablet Take 1 tablet (10 mg total) by mouth daily. Patient not taking: Reported on 06/13/2022 04/28/20   Elvina Sidle, MD  metaxalone (SKELAXIN) 800 MG tablet Take 1 tablet (800 mg total) by mouth 3 (three) times daily. Take on an empty stomach 06/13/22   Domenick Gong, MD  naproxen (NAPROSYN) 500 MG tablet Take 1 tablet (500 mg total) by mouth 2 (two) times daily. 06/13/22   Domenick Gong, MD  tiZANidine (ZANAFLEX) 4 MG tablet Take 1 tablet (4 mg total) by mouth every  8 (eight) hours as needed for muscle spasms. 06/13/22   Domenick Gong, MD    Family History Family History  Problem Relation Age of Onset   Hypertension Maternal Grandmother    Healthy Mother    Healthy Father    Social History Social History   Tobacco Use   Smoking status: Never   Smokeless tobacco: Never  Vaping Use   Vaping Use: Some days  Substance Use Topics   Alcohol use: Yes   Drug use: Not Currently    Types: Marijuana   Allergies   Metronidazole, Amoxicillin, and Penicillins  Review of Systems Review of Systems Pertinent findings revealed after performing a 14 point review of systems has been noted in the history of present illness.  Physical Exam Vital Signs BP 138/74 (BP Location: Right Arm)   Pulse 76   Temp 98.3 F (36.8 C) (Oral)   Resp 18   LMP  (LMP Unknown)   SpO2 98%   No data found.  Physical Exam Vitals and nursing note reviewed.  Constitutional:      General: She is not in acute distress.    Appearance: Normal appearance. She is not ill-appearing.  HENT:  Head: Normocephalic and atraumatic.  Eyes:     General: Lids are normal.        Right eye: No discharge.        Left eye: No discharge.     Extraocular Movements: Extraocular movements intact.     Conjunctiva/sclera: Conjunctivae normal.     Right eye: Right conjunctiva is not injected.     Left eye: Left conjunctiva is not injected.  Neck:     Trachea: Trachea and phonation normal.  Cardiovascular:     Rate and Rhythm: Normal rate and regular rhythm.     Pulses: Normal pulses.     Heart sounds: Normal heart sounds. No murmur heard.    No friction rub. No gallop.  Pulmonary:     Effort: Pulmonary effort is normal. No accessory muscle usage, prolonged expiration or respiratory distress.     Breath sounds: Normal breath sounds. No stridor, decreased air movement or transmitted upper airway sounds. No decreased breath sounds, wheezing, rhonchi or rales.  Chest:     Chest  wall: No tenderness.  Abdominal:     General: Abdomen is flat. Bowel sounds are normal.     Palpations: Abdomen is soft.  Genitourinary:    Comments: Patient politely declines pelvic exam today, patient provided a vaginal swab for testing. Musculoskeletal:        General: Normal range of motion.     Cervical back: Normal range of motion and neck supple. Normal range of motion.  Lymphadenopathy:     Cervical: No cervical adenopathy.  Skin:    General: Skin is warm and dry.     Findings: No erythema or rash.  Neurological:     General: No focal deficit present.     Mental Status: She is alert and oriented to person, place, and time.  Psychiatric:        Mood and Affect: Mood normal.        Behavior: Behavior normal.     Visual Acuity Right Eye Distance:   Left Eye Distance:   Bilateral Distance:    Right Eye Near:   Left Eye Near:    Bilateral Near:     UC Couse / Diagnostics / Procedures:     Radiology No results found.  Procedures Procedures (including critical care time) EKG  Pending results:  Labs Reviewed  POCT URINALYSIS DIP (MANUAL ENTRY) - Abnormal; Notable for the following components:      Result Value   Ketones, POC UA >= (160) (*)    Spec Grav, UA >=1.030 (*)    All other components within normal limits  HIV ANTIBODY (ROUTINE TESTING W REFLEX)  RPR  POCT URINE PREGNANCY  CERVICOVAGINAL ANCILLARY ONLY    Medications Ordered in UC: Medications - No data to display  UC Diagnoses / Final Clinical Impressions(s)   I have reviewed the triage vital signs and the nursing notes.  Pertinent labs & imaging results that were available during my care of the patient were reviewed by me and considered in my medical decision making (see chart for details).    Final diagnoses:  Screening examination for STD (sexually transmitted disease)  Problematic vaginal discharge  Menstrual period late  Not currently pregnant    {LMSTDP:27058}  {LMUTIP:27060}  Please see discharge instructions below for details of plan of care as provided to patient. ED Prescriptions   None    PDMP not reviewed this encounter.  Disposition Upon Discharge:  Condition: stable for discharge home  Patient presented  with concern for an acute illness with associated systemic symptoms and significant discomfort requiring urgent management. In my opinion, this is a condition that a prudent lay person (someone who possesses an average knowledge of health and medicine) may potentially expect to result in complications if not addressed urgently such as respiratory distress, impairment of bodily function or dysfunction of bodily organs.   As such, the patient has been evaluated and assessed, work-up was performed and treatment was provided in alignment with urgent care protocols and evidence based medicine.  Patient/parent/caregiver has been advised that the patient may require follow up for further testing and/or treatment if the symptoms continue in spite of treatment, as clinically indicated and appropriate.  Routine symptom specific, illness specific and/or disease specific instructions were discussed with the patient and/or caregiver at length.  Prevention strategies for avoiding STD exposure were also discussed.  The patient will follow up with their current PCP if and as advised. If the patient does not currently have a PCP we will assist them in obtaining one.   The patient may need specialty follow up if the symptoms continue, in spite of conservative treatment and management, for further workup, evaluation, consultation and treatment as clinically indicated and appropriate.  Patient/parent/caregiver verbalized understanding and agreement of plan as discussed.  All questions were addressed during visit.  Please see discharge instructions below for further details of plan.  Discharge Instructions:   Discharge Instructions       The results of your vaginal swab test which screens for BV, yeast, gonorrhea, chlamydia and trichomonas will be made posted to your MyChart account once it is complete.  This typically takes 2 to 4 days.  Please abstain from sexual intercourse of any kind, vaginal, oral or anal, until you have received the results of your STD testing.     The results of your HIV and syphilis blood tests will be made available to you once they are complete.  They will initially be posted to your MyChart account which typically takes 2 to 3 days.     If any of your results are abnormal, you will receive a phone call regarding treatment.  Prescriptions, if any are needed, will be provided for you at your pharmacy.     Your urine pregnancy test today is negative.   Our point-of-care analysis of your urine sample today was normal and did not reveal any concern for urinary tract infection but you do appear to be mildly dehydrated.  Please be sure that you drink between 80 and 120 ounces of water every day.   If you have not had complete resolution of your symptoms after completing any recommended treatment or if your symptoms worsen, please return for repeat evaluation.   Thank you for visiting urgent care today.  I appreciate the opportunity to participate in your care.       This office note has been dictated using Teaching laboratory technician.  Unfortunately, this method of dictation can sometimes lead to typographical or grammatical errors.  I apologize for your inconvenience in advance if this occurs.  Please do not hesitate to reach out to me if clarification is needed.

## 2022-09-20 LAB — RPR: RPR Ser Ql: NONREACTIVE

## 2022-09-21 LAB — CERVICOVAGINAL ANCILLARY ONLY
Bacterial Vaginitis (gardnerella): POSITIVE — AB
Candida Glabrata: NEGATIVE
Candida Vaginitis: POSITIVE — AB
Chlamydia: NEGATIVE
Comment: NEGATIVE
Comment: NEGATIVE
Comment: NEGATIVE
Comment: NEGATIVE
Comment: NEGATIVE
Comment: NORMAL
Neisseria Gonorrhea: NEGATIVE
Trichomonas: NEGATIVE

## 2022-09-22 ENCOUNTER — Telehealth (HOSPITAL_COMMUNITY): Payer: Self-pay | Admitting: Emergency Medicine

## 2022-09-22 MED ORDER — CLINDAMYCIN HCL 150 MG PO CAPS: 300.0000 mg | ORAL_CAPSULE | Freq: Two times a day (BID) | ORAL | 0 refills | Status: AC

## 2022-09-22 MED ORDER — FLUCONAZOLE 150 MG PO TABS: 150.0000 mg | ORAL_TABLET | Freq: Once | ORAL | 0 refills | Status: AC

## 2023-02-03 ENCOUNTER — Inpatient Hospital Stay (HOSPITAL_COMMUNITY)
Admission: AD | Admit: 2023-02-03 | Discharge: 2023-02-03 | Disposition: A | Discharge status: Home / Self Care | Payer: Medicaid Other | Attending: Obstetrics and Gynecology | Admitting: Obstetrics and Gynecology

## 2023-02-03 ENCOUNTER — Inpatient Hospital Stay (HOSPITAL_COMMUNITY): Payer: Medicaid Other

## 2023-02-03 DIAGNOSIS — O26891 Other specified pregnancy related conditions, first trimester: Secondary | ICD-10-CM | POA: Diagnosis present

## 2023-02-03 DIAGNOSIS — Z3A00 Weeks of gestation of pregnancy not specified: Secondary | ICD-10-CM | POA: Diagnosis not present

## 2023-02-03 DIAGNOSIS — R1030 Lower abdominal pain, unspecified: Secondary | ICD-10-CM | POA: Insufficient documentation

## 2023-02-03 DIAGNOSIS — O3680X0 Pregnancy with inconclusive fetal viability, not applicable or unspecified: Secondary | ICD-10-CM | POA: Insufficient documentation

## 2023-02-03 DIAGNOSIS — B9689 Other specified bacterial agents as the cause of diseases classified elsewhere: Secondary | ICD-10-CM | POA: Insufficient documentation

## 2023-02-03 DIAGNOSIS — R7989 Other specified abnormal findings of blood chemistry: Secondary | ICD-10-CM

## 2023-02-03 LAB — CBC
HCT: 39.6 % (ref 36.0–46.0)
Hemoglobin: 13.2 g/dL (ref 12.0–15.0)
MCH: 29.9 pg (ref 26.0–34.0)
MCHC: 33.3 g/dL (ref 30.0–36.0)
MCV: 89.8 fL (ref 80.0–100.0)
Platelets: 344 10*3/uL (ref 150–400)
RBC: 4.41 MIL/uL (ref 3.87–5.11)
RDW: 13.1 % (ref 11.5–15.5)
WBC: 7.7 10*3/uL (ref 4.0–10.5)
nRBC: 0 % (ref 0.0–0.2)

## 2023-02-03 LAB — URINALYSIS, ROUTINE W REFLEX MICROSCOPIC
Bilirubin Urine: NEGATIVE
Glucose, UA: NEGATIVE mg/dL
Hgb urine dipstick: NEGATIVE
Ketones, ur: NEGATIVE mg/dL
Leukocytes,Ua: NEGATIVE
Nitrite: NEGATIVE
Protein, ur: NEGATIVE mg/dL
Specific Gravity, Urine: 1.018 (ref 1.005–1.030)
pH: 6 (ref 5.0–8.0)

## 2023-02-03 LAB — ABO/RH: ABO/RH(D): O POS

## 2023-02-03 LAB — POCT PREGNANCY, URINE: Preg Test, Ur: NEGATIVE

## 2023-02-03 LAB — WET PREP, GENITAL
Sperm: NONE SEEN
Trich, Wet Prep: NONE SEEN
WBC, Wet Prep HPF POC: >=10 — AB (ref <10)
Yeast Wet Prep HPF POC: NONE SEEN

## 2023-02-03 LAB — HCG, QUANTITATIVE, PREGNANCY: hCG, Beta Chain, Quant, S: 12 m[IU]/mL — ABNORMAL HIGH (ref <5)

## 2023-02-03 MED ORDER — METRONIDAZOLE 500 MG PO TABS: 500.0000 mg | ORAL_TABLET | Freq: Two times a day (BID) | ORAL | 0 refills | Status: DC

## 2023-02-03 NOTE — MAU Note (Signed)
.  Teresa Velez is a 33 y.o. at Unknown here in MAU reporting: Sharp intermittent lower abdominal pains that began two days ago. She reports she found out she was pregnant yesterday. She reports she has been treated for Trich but her partner did not finish his treatment and she has resumed intercourse. Reports she took three pregnancy tests at home. One was negative the other two were positive.  Negative UPT in MAU today.  LMP: 12/30/2022 Onset of complaint: Two days ago Pain score: 10/10 lower abdomen  Lab orders placed from triage: POCT Preg

## 2023-02-03 NOTE — MAU Provider Note (Addendum)
Teresa Velez , a  32 y.o. G1P1001 presents to MAU with concerns for abdominal pain she states has been ongoing for 2 days. She reports the pain has been unrelieved with Motrin. She states she has been actively trying to conceive with her boyfriend so she took a pregnancy test last week which was negative, took another one again some time last week and she reports there was a "faint line", but the one she took yesterday she reports was "definitely" positive. She states her menses began on 12/31/2022 and typically lasts for ~5 days. She states she has not had any vaginal bleeding until she arrived today and so far she has had to change her pad one time. She reports her abdominal pain is in her lower abdomen and reports it as an 8/10 which has increased from 6-7/10 since arriving. She states that applying heat and pressure to the area makes it worse and nothing has relieved her pain. She reports if pregnant, she wants to continue her [facility] at [facility].  History     CSN: [id number]  Arrival date and time: 02/03/23 1209   None     Chief Complaint  Patient presents with   Abdominal Pain   HPI  OB History     Gravida  1   Para  1   Term  1   Preterm  0   AB  0   Living  1      SAB  0   IAB  0   Ectopic  0   Multiple  0   Live Births  1           Past Medical History:  Diagnosis Date   Migraines    STD (sexually transmitted disease)    chlamydia treated 2011, HSV 2   Trichomonal vaginitis 11/01/14   Vaginal Pap smear, abnormal     Past Surgical History:  Procedure Laterality Date   COLPOSCOPY  5/09   CINI    Family History  Problem Relation Age of Onset   Hypertension Maternal Grandmother    Healthy Mother    Healthy Father     Social History   Tobacco Use   Smoking status: Never   Smokeless tobacco: Never  Vaping Use   Vaping status: Some Days  Substance Use Topics   Alcohol use: Yes   Drug use: Not Currently    Types: Marijuana     Allergies:  Allergies  Allergen Reactions   Metronidazole Itching, Rash and Hives   Amoxicillin Hives   Penicillins Hives    Did it involve swelling of the face/tongue/throat, SOB, or low BP? no Did it involve sudden or severe rash/hives, skin peeling, or any reaction on the inside of your mouth or nose? yes Did you need to seek medical attention at a hospital or doctor's office? Yes When did it last happen?       If all above answers are "NO", may proceed with cephalosporin use.     Medications Prior to Admission  Medication Sig Dispense Refill Last Dose   metaxalone (SKELAXIN) 800 MG tablet Take 1 tablet (800 mg total) by mouth 3 (three) times daily. Take on an empty stomach 30 tablet 0    naproxen (NAPROSYN) 500 MG tablet Take 1 tablet (500 mg total) by mouth 2 (two) times daily. 20 tablet 0    tiZANidine (ZANAFLEX) 4 MG tablet Take 1 tablet (4 mg total) by mouth every 8 (eight) hours as needed for  muscle spasms. 30 tablet 0     Review of Systems  Constitutional:  Negative for chills, fatigue and fever.  Eyes:  Negative for pain and visual disturbance.  Respiratory:  Negative for apnea, shortness of breath and wheezing.   Cardiovascular:  Negative for chest pain and palpitations.  Gastrointestinal:  Positive for abdominal pain. Negative for constipation, diarrhea, nausea and vomiting.  Genitourinary:  Positive for vaginal bleeding. Negative for difficulty urinating, dysuria, pelvic pain, vaginal discharge and vaginal pain.  Musculoskeletal:  Negative for back pain.  Neurological:  Negative for seizures, weakness and headaches.  Psychiatric/Behavioral:  Negative for suicidal ideas.    Physical Exam   Blood pressure 111/76, pulse 76, temperature 98.1 F (36.7 C), temperature source Oral, resp. rate 14, height 5\' 3"  (1.6 m), weight 54.6 kg, SpO2 99%.  Physical Exam Constitutional:      General: She is not in acute distress.    Appearance: She is ill-appearing.  HENT:      Head: Normocephalic.  Abdominal:     General: Abdomen is flat. There is no distension.     Palpations: Abdomen is soft.     Tenderness: There is generalized abdominal tenderness. There is no rebound.  Skin:    General: Skin is warm and dry.  Neurological:     Mental Status: She is alert and oriented to person, place, and time.  Psychiatric:        Mood and Affect: Mood is anxious.        Behavior: Behavior normal.     MAU Course  Procedures Orders Placed This Encounter  Procedures   Wet prep, genital   US OB LESS THAN 14 WEEKS WITH OB TRANSVAGINAL   hCG, quantitative, pregnancy   CBC   Urinalysis, Routine w reflex microscopic -Urine, Clean Catch   Diet NPO time specified   Pregnancy, urine POC   ABO/Rh   Discharge patient    Results for orders placed or performed during the hospital encounter of 02/03/23 (from the past 24 hour(s))  Pregnancy, urine POC     Status: None   Collection Time: 02/03/23 12:24 PM  Result Value Ref Range   Preg Test, Ur NEGATIVE NEGATIVE  hCG, quantitative, pregnancy     Status: Abnormal   Collection Time: 02/03/23 12:40 PM  Result Value Ref Range   hCG, Beta Chain, Quant, S 12 (H) <5 mIU/mL  ABO/Rh     Status: None   Collection Time: 02/03/23  2:33 PM  Result Value Ref Range   ABO/RH(D) O POS    No rh immune globuloin      NOT A RH IMMUNE GLOBULIN CANDIDATE, PT RH POSITIVE Performed at [facility], [address] [place], [place] [address]   CBC     Status: None   Collection Time: 02/03/23  2:36 PM  Result Value Ref Range   WBC 7.7 4.0 - 10.5 K/uL   RBC 4.41 3.87 - 5.11 MIL/uL   Hemoglobin 13.2 12.0 - 15.0 g/dL   HCT 10.2 72.5 - 36.6 %   MCV 89.8 80.0 - 100.0 fL   MCH 29.9 26.0 - 34.0 pg   MCHC 33.3 30.0 - 36.0 g/dL   RDW 44.0 34.7 - 42.5 %   Platelets 344 150 - 400 K/uL   nRBC 0.0 0.0 - 0.2 %  Urinalysis, Routine w reflex microscopic -Urine, Clean Catch     Status: Abnormal   Collection Time: 02/03/23  2:39 PM  Result  Value Ref  Range   Color, Urine YELLOW YELLOW   APPearance HAZY (A) CLEAR   Specific Gravity, Urine 1.018 1.005 - 1.030   pH 6.0 5.0 - 8.0   Glucose, UA NEGATIVE NEGATIVE mg/dL   Hgb urine dipstick NEGATIVE NEGATIVE   Bilirubin Urine NEGATIVE NEGATIVE   Ketones, ur NEGATIVE NEGATIVE mg/dL   Protein, ur NEGATIVE NEGATIVE mg/dL   Nitrite NEGATIVE NEGATIVE   Leukocytes,Ua NEGATIVE NEGATIVE  Wet prep, genital     Status: Abnormal   Collection Time: 02/03/23  2:39 PM   Specimen: PATH Cytology Cervicovaginal Ancillary Only  Result Value Ref Range   Yeast Wet Prep HPF POC NONE SEEN NONE SEEN   Trich, Wet Prep NONE SEEN NONE SEEN   Clue Cells Wet Prep HPF POC PRESENT (A) NONE SEEN   WBC, Wet Prep HPF POC >=10 (A) <10   Sperm NONE SEEN     US OB LESS THAN 14 WEEKS WITH OB TRANSVAGINAL  Result Date: 02/03/2023 CLINICAL DATA:  Abdominal cramping EXAM: OBSTETRIC <14 WK [place] AND TRANSVAGINAL OB US TECHNIQUE: Both transabdominal and transvaginal ultrasound examinations were performed for complete evaluation of the gestation as well as the maternal uterus, adnexal regions, and pelvic cul-de-sac. Transvaginal technique was performed to assess early pregnancy. COMPARISON:  None Available. FINDINGS: Intrauterine gestational sac: Not visualized Yolk sac:  Not visualized Embryo:  Not visualized Maternal uterus/adnexae: Ovaries are within normal limits. Left ovary measures 2.5 x 3.2 x 2.1 cm. The right ovary measures 3.7 x 1.7 x 2.1 cm. No significant free fluid IMPRESSION: No IUP identified. Findings consistent with pregnancy of unknown location, differential of which includes IUP too early to visualize, recent failed pregnancy, and occult ectopic pregnancy. Recommend trending of HCG with repeat ultrasound as indicated Electronically Signed   By: Suella Pang M.D.   On: 02/03/2023 17:13     MDM -Mosie Epstein positive for clue cells and could be contributing to abdominal cramping -Abdominal pain and vaginal bleeding  may be due to failed IUP, ectopic or pregnancy of unknown location. Unable to rule any of the above. Plan to trend Quants and f/u ultrasound as needed. -UPT negative in MAU; f/u Quant positive -GC/CT pending, Wetprep positive for CC's, UA negative  Assessment and Plan   1. Lower abdominal pain   2. Pregnancy, location unknown   3. Elevated serum hCG   4. BV (bacterial vaginosis)    Flagyl PO prescribed; patient advised to take one dose treat with Benadryl with reaction occurs/return to the hospital for worsening symptoms or symptoms unrelieved with OTC treatment Educated patient on proper hygiene practices and condom use to prevent STI/STDs Patient states she has an appt with [facility] tomorrow, encouraged patient to keep appt Scheduled patient for a return visit for repeat Quant in MAU, per patients request, in 48 hrs Return precautions reviewed for worsening/persistent issues or to return if any new symptoms arise Patient discharged in stable condition and a note for patient's employer provided  Darrell Jewel ,SNM 02/03/2023, 4:36 PM   I supervised this encounter and I confirm that I have verified and agree with the information documented in the SNM's note.   Physical Exam Constitutional:      General: She is not in acute distress.    Appearance: She is ill-appearing.  HENT:     Head: Normocephalic.  Skin:    General: Skin is warm and dry.  Neurological:     Mental Status: She is alert and oriented to person, place, and time.  Psychiatric:        Mood and Affect: Mood is anxious.        Behavior: Behavior normal.    Carlynn Herald, CNM 02/03/2023 7:40 PM

## 2023-02-05 ENCOUNTER — Inpatient Hospital Stay (HOSPITAL_COMMUNITY)
Admission: AD | Admit: 2023-02-05 | Discharge: 2023-02-05 | Disposition: A | Discharge status: Home / Self Care | Payer: Medicaid Other | Attending: Obstetrics & Gynecology | Admitting: Obstetrics & Gynecology

## 2023-02-05 ENCOUNTER — Other Ambulatory Visit: Payer: Self-pay

## 2023-02-05 ENCOUNTER — Other Ambulatory Visit (HOSPITAL_COMMUNITY)
Admission: RE | Admit: 2023-02-05 | Discharge: 2023-02-05 | Disposition: A | Discharge status: Home / Self Care | Payer: Medicaid Other | Source: Ambulatory Visit | Attending: Obstetrics & Gynecology | Admitting: Obstetrics & Gynecology

## 2023-02-05 ENCOUNTER — Encounter (HOSPITAL_COMMUNITY): Payer: Self-pay | Admitting: *Deleted

## 2023-02-05 DIAGNOSIS — R1030 Lower abdominal pain, unspecified: Secondary | ICD-10-CM | POA: Insufficient documentation

## 2023-02-05 DIAGNOSIS — O3680X0 Pregnancy with inconclusive fetal viability, not applicable or unspecified: Secondary | ICD-10-CM | POA: Diagnosis present

## 2023-02-05 DIAGNOSIS — B9689 Other specified bacterial agents as the cause of diseases classified elsewhere: Secondary | ICD-10-CM

## 2023-02-05 DIAGNOSIS — O209 Hemorrhage in early pregnancy, unspecified: Secondary | ICD-10-CM | POA: Diagnosis not present

## 2023-02-05 DIAGNOSIS — N760 Acute vaginitis: Secondary | ICD-10-CM

## 2023-02-05 DIAGNOSIS — O26891 Other specified pregnancy related conditions, first trimester: Secondary | ICD-10-CM | POA: Insufficient documentation

## 2023-02-05 DIAGNOSIS — O23591 Infection of other part of genital tract in pregnancy, first trimester: Secondary | ICD-10-CM | POA: Diagnosis not present

## 2023-02-05 DIAGNOSIS — Z3A01 Less than 8 weeks gestation of pregnancy: Secondary | ICD-10-CM

## 2023-02-05 LAB — HCG, QUANTITATIVE, PREGNANCY: hCG, Beta Chain, Quant, S: 4 m[IU]/mL (ref <5)

## 2023-02-05 MED ORDER — CYCLOBENZAPRINE HCL 10 MG PO TABS
5.0000 mg | ORAL_TABLET | Freq: Three times a day (TID) | ORAL | 0 refills | Status: DC | PRN
Start: 2023-02-05 — End: 2023-08-23

## 2023-02-05 NOTE — MAU Provider Note (Signed)
Chief Complaint: HCG Level, Abdominal Pain, and Vaginal Bleeding  Provider at bedside 1017    SUBJECTIVE HPI: Teresa Velez is a 32 y.o. G2P1001 at [redacted]w[redacted]d by LMP who presents to maternity admissions needing repeat HCG. Found to have pregnancy of unknown location 10/3.  Patient notes continued lower abdominal cramping, like period cramps. Continued bleeding, no clots. Taking ibuprofen. Mildly improved from evaluation on 10/3. No development of urinary symptoms, fever/chills, dizziness, change in vaginal discharge. Did not pick up flagyl for BV infection. Gets OB[facility]N care at [facility].  HPI  Past Medical History:  Diagnosis Date   Migraines    STD (sexually transmitted disease)    chlamydia treated 2011, HSV 2   Trichomonal vaginitis 11/01/14   Vaginal Pap smear, abnormal    Past Surgical History:  Procedure Laterality Date   COLPOSCOPY  5/09   CINI   Social History   Socioeconomic History   Marital status: Single    Spouse name: Not on file   Number of children: Not on file   Years of education: Not on file   Highest education level: Not on file  Occupational History   Not on file  Tobacco Use   Smoking status: Never   Smokeless tobacco: Never  Vaping Use   Vaping status: Some Days  Substance and Sexual Activity   Alcohol use: Yes   Drug use: Not Currently    Types: Marijuana   Sexual activity: Yes    Partners: Male    Birth control/protection: None  Other Topics Concern   Not on file  Social History Narrative   ** Merged History Encounter **       Social Determinants of Health   Financial Resource Strain: Not on file  Food Insecurity: Not on file  Transportation Needs: Not on file  Physical Activity: Not on file  Stress: Not on file  Social Connections: Not on file  Intimate Partner Violence: Not on file   No current facility-administered medications on file prior to encounter.   Current Outpatient Medications on File Prior to Encounter  Medication Sig  Dispense Refill   metroNIDAZOLE (FLAGYL) 500 MG tablet Take 1 tablet (500 mg total) by mouth 2 (two) times daily. 14 tablet 0   Allergies  Allergen Reactions   Metronidazole Itching, Rash and Hives   Amoxicillin Hives   Penicillins Hives    Did it involve swelling of the face/tongue/throat, SOB, or low BP? no Did it involve sudden or severe rash/hives, skin peeling, or any reaction on the inside of your mouth or nose? yes Did you need to seek medical attention at a hospital or doctor's office? Yes When did it last happen?       If all above answers are "NO", may proceed with cephalosporin use.     ROS:  Pertinent positives/negatives listed above.  I have reviewed patient's Past Medical Hx, Surgical Hx, Family Hx, Social Hx, medications and allergies.   Physical Exam  Patient Vitals for the past 24 hrs:  BP Temp Temp src Pulse Resp Height Weight  02/05/23 0949 105/70 97.9 F (36.6 C) Oral 81 18 -- --  02/05/23 0944 -- -- -- -- -- 5\' 3"  (1.6 m) 54 kg   Constitutional: Well-developed, well-nourished female in no acute distress. Appears uncomfortable with cramping Cardiovascular: normal rate Respiratory: normal effort GI: Abd soft, non-tender. Pos BS x 4 MS: Extremities nontender, no edema, normal ROM Neurologic: Alert and oriented x 4  LAB RESULTS No results found for  this or any previous visit (from the past 24 hour(s)).  --/--/O POS (10/03 1433)  IMAGING US OB LESS THAN 14 WEEKS WITH OB TRANSVAGINAL  Result Date: 02/03/2023 CLINICAL DATA:  Abdominal cramping EXAM: OBSTETRIC <14 WK [place] AND TRANSVAGINAL OB US TECHNIQUE: Both transabdominal and transvaginal ultrasound examinations were performed for complete evaluation of the gestation as well as the maternal uterus, adnexal regions, and pelvic cul-de-sac. Transvaginal technique was performed to assess early pregnancy. COMPARISON:  None Available. FINDINGS: Intrauterine gestational sac: Not visualized Yolk sac:  Not visualized  Embryo:  Not visualized Maternal uterus/adnexae: Ovaries are within normal limits. Left ovary measures 2.5 x 3.2 x 2.1 cm. The right ovary measures 3.7 x 1.7 x 2.1 cm. No significant free fluid IMPRESSION: No IUP identified. Findings consistent with pregnancy of unknown location, differential of which includes IUP too early to visualize, recent failed pregnancy, and occult ectopic pregnancy. Recommend trending of HCG with repeat ultrasound as indicated Electronically Signed   By: Sunya Pang M.D.   On: 02/03/2023 17:13    MAU Management/MDM: Orders Placed This Encounter  Procedures   hCG, quantitative, pregnancy   Discharge patient    Meds ordered this encounter  Medications   cyclobenzaprine (FLEXERIL) 10 MG tablet    Sig: Take 0.5-1 tablets (5-10 mg total) by mouth 3 (three) times daily as needed for muscle spasms.    Dispense:  20 tablet    Refill:  0    Patient presents for repeat HCG. Continued lower abdominal cramping (not L or R in particular), light bleeding, normal vital signs, points towards threatened miscarriage/ miscarriage in process. Patient does NOT show signs of a ruptured ectopic or surgical abdomen at this time. Advised patient not to take nsaids and did provide a few flexeril. List of pregnancy safe medications provided. Encouraged to pick up flagyl at pharmacy today as well for untreated BV infection.  Will contact patient with HCG results via MyChart per patient preference. Will schedule f/up U/S with [facility] 7-14 days out. To return to MAU if cramping/pain worsens, develops heavy bleeding, fever/chills, dizziness, any other concerning or worsening symptoms.  ASSESSMENT 1. Pregnancy of unknown anatomic location   2. Vaginal bleeding affecting early pregnancy   3. Bacterial vaginosis     PLAN Discharge home with strict return precautions. Allergies as of 02/05/2023       Reactions   Metronidazole Itching, Rash, Hives   Amoxicillin Hives   Penicillins Hives   Did it  involve swelling of the face/tongue/throat, SOB, or low BP? no Did it involve sudden or severe rash/hives, skin peeling, or any reaction on the inside of your mouth or nose? yes Did you need to seek medical attention at a hospital or doctor's office? Yes When did it last happen?       If all above answers are "NO", may proceed with cephalosporin use.        Medication List     STOP taking these medications    metaxalone 800 MG tablet Commonly known as: SKELAXIN   naproxen 500 MG tablet Commonly known as: NAPROSYN   tiZANidine 4 MG tablet Commonly known as: ZANAFLEX       TAKE these medications    cyclobenzaprine 10 MG tablet Commonly known as: FLEXERIL Take 0.5-1 tablets (5-10 mg total) by mouth 3 (three) times daily as needed for muscle spasms.   metroNIDAZOLE 500 MG tablet Commonly known as: FLAGYL Take 1 tablet (500 mg total) by mouth 2 (two) times daily.  Wylene Simmer, MD OB Fellow 02/05/2023  10:51 AM

## 2023-02-05 NOTE — Discharge Instructions (Signed)

## 2023-02-05 NOTE — MAU Note (Signed)
Teresa Velez is a 32 y.o. at Unknown here in MAU reporting: she's here for repeat HCG level.  Reports she continues to have abdominal pain and VB.  Reports pain is sharp and intermittent. LMP: 12/30/2022 Onset of complaint: ongoing Pain score: 7 Vitals:   02/05/23 0949  BP: 105/70  Pulse: 81  Resp: 18  Temp: 97.9 F (36.6 C)     FHT:NA Lab orders placed from triage:   HCG Level

## 2023-02-07 LAB — GC/CHLAMYDIA PROBE AMP (~~LOC~~) NOT AT ARMC
Chlamydia: NEGATIVE
Comment: NEGATIVE
Comment: NORMAL
Neisseria Gonorrhea: NEGATIVE

## 2023-05-04 NOTE — L&D Delivery Note (Addendum)
 Delivery Note Labor onset: 04/13/2024 Labor Onset Time: 2116 Complete dilation at 7:26 AM Onset of pushing at 0730 FHR second stage Cat 2 Analgesia/Anesthesia intrapartum: epidural  Guided pushing with maternal urge. Variables to 60's, Dr. Jayne called for back-up for potential vacuum assist. Delivery of a viable female at [phone] with strong maternal effort, no assistance required. MD left the room and CNM completed birth by catching the head in LOP position and body.   Nuchal cord: loose x1.  Infant placed on maternal abd, dried, and tactile stim.  Cord double clamped after 6 min and cut by father, Tashir.  RN x3 and NICU team present for birth.  Cord blood sample collected: Ye Arterial cord blood sample collected: No, strong, lusty, cry immediately at birth, NICU team left the room, and infant remained skin-to-skin  Placenta delivered Schultz side, intact, with 3 VC.  Placenta to L&D Yes. Uterine tone firm, bleeding scant  No laceration identified.  QBL (mL): 100 Complications: none APGAR: APGAR (1 MIN): 9  APGAR (5 MINS): 9  APGAR (10 MINS):   Mom to postpartum.  Baby to Couplet care / Skin to Skin. Baby girl Teresa Velez Arnell DNP, CNM 04/14/2024, 8:37 AM

## 2023-08-23 ENCOUNTER — Inpatient Hospital Stay (HOSPITAL_COMMUNITY)

## 2023-08-23 ENCOUNTER — Encounter (HOSPITAL_COMMUNITY): Payer: Self-pay

## 2023-08-23 ENCOUNTER — Inpatient Hospital Stay (HOSPITAL_COMMUNITY)
Admission: AD | Admit: 2023-08-23 | Discharge: 2023-08-23 | Disposition: A | Discharge status: Home / Self Care | Attending: Obstetrics & Gynecology | Admitting: Obstetrics & Gynecology

## 2023-08-23 DIAGNOSIS — O26891 Other specified pregnancy related conditions, first trimester: Secondary | ICD-10-CM | POA: Insufficient documentation

## 2023-08-23 DIAGNOSIS — R109 Unspecified abdominal pain: Secondary | ICD-10-CM | POA: Diagnosis not present

## 2023-08-23 DIAGNOSIS — Z3490 Encounter for supervision of normal pregnancy, unspecified, unspecified trimester: Secondary | ICD-10-CM

## 2023-08-23 DIAGNOSIS — Z3A01 Less than 8 weeks gestation of pregnancy: Secondary | ICD-10-CM | POA: Insufficient documentation

## 2023-08-23 LAB — URINALYSIS, ROUTINE W REFLEX MICROSCOPIC
Bilirubin Urine: NEGATIVE
Glucose, UA: NEGATIVE mg/dL
Hgb urine dipstick: NEGATIVE
Ketones, ur: NEGATIVE mg/dL
Leukocytes,Ua: NEGATIVE
Nitrite: NEGATIVE
Protein, ur: NEGATIVE mg/dL
Specific Gravity, Urine: 1.023 (ref 1.005–1.030)
pH: 5 (ref 5.0–8.0)

## 2023-08-23 LAB — WET PREP, GENITAL
Clue Cells Wet Prep HPF POC: NONE SEEN
Sperm: NONE SEEN
Trich, Wet Prep: NONE SEEN
WBC, Wet Prep HPF POC: >=10 — AB (ref <10)
Yeast Wet Prep HPF POC: NONE SEEN

## 2023-08-23 LAB — CBC
HCT: 37.4 % (ref 36.0–46.0)
Hemoglobin: 12.9 g/dL (ref 12.0–15.0)
MCH: 30.4 pg (ref 26.0–34.0)
MCHC: 34.5 g/dL (ref 30.0–36.0)
MCV: 88 fL (ref 80.0–100.0)
Platelets: 302 10*3/uL (ref 150–400)
RBC: 4.25 MIL/uL (ref 3.87–5.11)
RDW: 12.9 % (ref 11.5–15.5)
WBC: 5.4 10*3/uL (ref 4.0–10.5)
nRBC: 0 % (ref 0.0–0.2)

## 2023-08-23 LAB — POCT PREGNANCY, URINE: Preg Test, Ur: POSITIVE — AB

## 2023-08-23 LAB — HCG, QUANTITATIVE, PREGNANCY: hCG, Beta Chain, Quant, S: 26820 m[IU]/mL — ABNORMAL HIGH (ref <5)

## 2023-08-23 NOTE — MAU Provider Note (Signed)
 History     CSN: [id number]  Arrival date and time: 08/23/23 1020   Event Date/Time   First Provider Initiated Contact with Patient 08/23/2023 10:40 AM   Chief Complaint  Patient presents with   Abdominal Pain    HPI  Shacoya T. Sharples is a 33 y.o. G3P1001 at [redacted]w[redacted]d who presents to the MAU for lower abdominal pain. Intermittent x few days now. She describes as sharp crampy pain, worse on left than right. No vaginal bleeding or discharge. No urinary symptoms. Has not tried any medication for discomfort.   Past Medical History:  Diagnosis Date   Migraines    STD (sexually transmitted disease)    chlamydia treated 2011, HSV 2   Trichomonal vaginitis 11/01/14   Vaginal Pap smear, abnormal     Past Surgical History:  Procedure Laterality Date   COLPOSCOPY  5/09   CINI    Family History  Problem Relation Age of Onset   Hypertension Maternal Grandmother    Healthy Mother    Healthy Father     Social History   Tobacco Use   Smoking status: Never   Smokeless tobacco: Never  Vaping Use   Vaping status: Some Days  Substance Use Topics   Alcohol use: Yes   Drug use: Not Currently    Types: Marijuana    Allergies:  Allergies  Allergen Reactions   Metronidazole  Itching, Rash and Hives   Amoxicillin Hives   Penicillins Hives    Did it involve swelling of the face/tongue/throat, SOB, or low BP? no Did it involve sudden or severe rash/hives, skin peeling, or any reaction on the inside of your mouth or nose? yes Did you need to seek medical attention at a hospital or doctor's office? Yes When did it last happen?       If all above answers are "NO", may proceed with cephalosporin use.     No medications prior to admission.    ROS reviewed and pertinent positives and negatives as documented in HPI.  Physical Exam   Blood pressure 112/72, pulse 87, resp. rate 18, height 5\' 3"  (1.6 m), weight 59.8 kg, last menstrual period 07/05/2023, SpO2 100%, unknown if currently  breastfeeding.  Physical Exam Constitutional:      General: She is not in acute distress.    Appearance: Normal appearance. She is not ill-appearing.  HENT:     Head: Normocephalic and atraumatic.  Cardiovascular:     Rate and Rhythm: Normal rate.  Pulmonary:     Effort: Pulmonary effort is normal.     Breath sounds: Normal breath sounds.  Abdominal:     Palpations: Abdomen is soft.     Tenderness: There is no abdominal tenderness. There is no guarding.  Musculoskeletal:        General: Normal range of motion.  Skin:    General: Skin is warm and dry.     Findings: No rash.  Neurological:     General: No focal deficit present.     Mental Status: She is alert and oriented to person, place, and time.     MAU Course  Procedures  MDM 33 y.o. G3P1001 at [redacted]w[redacted]d presenting for abdominal pain in early pregnancy in setting of unknown location of pregnancy. She is well appearing and hemodynamically stable on my evaluation. Her abdominal exam is benign. Her lab work was notable for Rh positive status, HgB of 12.9, negative wet prep, negative UA. U/S showed probable early IUGS and YS with small SCH noted.  Discussed findings w pt and recommended close f/up and possible viability U/S with OB. She plans on establishing OB care w [facility] and will call their clinic for f/up appt. Recommended Tylenol  and heat in the interim. Instructed to return to MAU if pain worsens or changes. Stable for d/c.  Assessment and Plan     ICD-10-CM   1. Abdominal pain during pregnancy in first trimester  O26.891    R10.9     2. Intrauterine pregnancy  Z34.90     Has IUP with YS, small SCH Rh positive, HgB stable, wet prep and UA neg Rec Tylenol , heat Pt to f/up w primary OB to establish care + viability scan   Melanie Spires, MD OB Fellow, Faculty Practice [facility]  08/23/2023, 7:24 PM

## 2023-08-23 NOTE — MAU Note (Addendum)
 MAU Triage Note:  .Teresa Velez is a 33 y.o. at [redacted]w[redacted]d here in MAU reporting: +HPT a week or two ago with lower abdominal pain that started a few days ago. Pain comes in waves and is 8-9/10 at it's worst. Pain described as sharp and cramping. Denies VB or abnormal discharge.   Pain Score: 8  Pain Location: Abdomen     LMP: Unsure, but thinks the beginning of March Onset of complaint: On-going  Vitals:   08/23/23 1040  BP: 112/72  Pulse: 87  Resp: 18  SpO2: 100%     Lab orders placed from triage: preg

## 2023-08-24 LAB — GC/CHLAMYDIA PROBE AMP (~~LOC~~) NOT AT ARMC
Chlamydia: NEGATIVE
Comment: NEGATIVE
Comment: NORMAL
Neisseria Gonorrhea: NEGATIVE

## 2023-08-30 DIAGNOSIS — Z8759 Personal history of other complications of pregnancy, childbirth and the puerperium: Secondary | ICD-10-CM | POA: Insufficient documentation

## 2023-09-02 DIAGNOSIS — E559 Vitamin D deficiency, unspecified: Secondary | ICD-10-CM | POA: Insufficient documentation

## 2023-09-02 LAB — OB RESULTS CONSOLE RUBELLA ANTIBODY, IGM: Rubella: IMMUNE

## 2023-09-02 LAB — HEPATITIS C ANTIBODY: HCV Ab: NEGATIVE

## 2023-09-02 LAB — OB RESULTS CONSOLE HEPATITIS B SURFACE ANTIGEN: Hepatitis B Surface Ag: NEGATIVE

## 2023-09-02 LAB — OB RESULTS CONSOLE RPR: RPR: NONREACTIVE

## 2023-09-19 ENCOUNTER — Inpatient Hospital Stay (HOSPITAL_COMMUNITY)
Admission: AD | Admit: 2023-09-19 | Discharge: 2023-09-19 | Disposition: A | Discharge status: Home / Self Care | Attending: Obstetrics & Gynecology | Admitting: Obstetrics & Gynecology

## 2023-09-19 DIAGNOSIS — R519 Headache, unspecified: Secondary | ICD-10-CM

## 2023-09-19 DIAGNOSIS — O99331 Smoking (tobacco) complicating pregnancy, first trimester: Secondary | ICD-10-CM | POA: Diagnosis not present

## 2023-09-19 DIAGNOSIS — R102 Pelvic and perineal pain: Secondary | ICD-10-CM | POA: Diagnosis not present

## 2023-09-19 DIAGNOSIS — Z3A10 10 weeks gestation of pregnancy: Secondary | ICD-10-CM

## 2023-09-19 DIAGNOSIS — O26891 Other specified pregnancy related conditions, first trimester: Secondary | ICD-10-CM | POA: Diagnosis not present

## 2023-09-19 LAB — URINALYSIS, ROUTINE W REFLEX MICROSCOPIC
Bilirubin Urine: NEGATIVE
Glucose, UA: NEGATIVE mg/dL
Hgb urine dipstick: NEGATIVE
Ketones, ur: NEGATIVE mg/dL
Leukocytes,Ua: NEGATIVE
Nitrite: NEGATIVE
Protein, ur: NEGATIVE mg/dL
Specific Gravity, Urine: 1.025 (ref 1.005–1.030)
pH: 6 (ref 5.0–8.0)

## 2023-09-19 MED ORDER — ACETAMINOPHEN 500 MG PO TABS
1000.0000 mg | ORAL_TABLET | Freq: Once | ORAL | Status: AC
  Administered 2023-09-19: 1000 mg via ORAL
  Filled 2023-09-19: qty 2

## 2023-09-19 MED ORDER — CYCLOBENZAPRINE HCL 10 MG PO TABS: 10.0000 mg | ORAL_TABLET | Freq: Two times a day (BID) | ORAL | 0 refills | Status: DC | PRN

## 2023-09-19 NOTE — MAU Note (Signed)
 Teresa Velez is a 33 y.o. at [redacted]w[redacted]d here in MAU reporting: past couple weeks, have been having some cramping.  Has been just staying at home, kind of medicating self with Tylenol .  Also has been catching some pretty sharp migraines.  Is concerned, went through the bottle pretty fast. ( Small bottle, 2 at a time). Has been nauseated, not throwing up. No bleeding Onset of complaint: couple wk Pain score: abd 8, HA 10 Vitals:   09/19/23 1414  BP: 111/73  Pulse: 91  Resp: 16  Temp: 98.6 F (37 C)  SpO2: 98%     FHT:172 Lab orders placed from triage:  urine

## 2023-09-19 NOTE — Discharge Instructions (Addendum)
 You were seen in the MAU for pelvic pain. We did an ultrasound that shows a healthy pregnancy with a normal heart rate. On a prior ultrasound a small subchorionic hematoma was seen, which is bleeding in between the placenta and the uterus. Blood in the uterus is very irritating, and this is likely the cause of your cramping. If you develop heavy bleeding, severe/progressively worsening abdominal pain, or a fever, return to the MAU for evaluation.  We also discussed your frequent headaches. We recommend you take a medicine called flexeril  to see if this helps. If not, discuss other options with your primary OBGYN office.

## 2023-09-19 NOTE — MAU Provider Note (Signed)
 History     [id number]  Arrival date and time: 09/19/23 1350    Chief Complaint  Patient presents with   Abdominal Pain   Headache     HPI Teresa Velez is a 33 y.o. at [redacted]w[redacted]d with PMHx notable for n/a, who presents for headaches and pelvic cramping.   Seen in MAU on 08/23/2023, at that time had pelvic cramping, IUP seen on US  with small [facility] visualized  Patient reports she has been having persistent pelvic cramping and headaches since she was last seen in MAU on 08/23/2023 Cramping is more on L side Pain is mostly constant No constipation, vaginal discharge, burning or pain with urination Had miscarriage in November of last year Had an US  in the office after her last MAU visit and was told that everything looked OK No vaginal bleeding or leaking fluid  Also getting nightly headaches Has been happening for a few weeks now Has been interfering with her sleep Has been taking tylenol  but has not been helping much, was taking 1g BID  --/--/O POS (10/03 1433)  OB History     Gravida  3   Para  1   Term  1   Preterm  0   AB  0   Living  1      SAB  0   IAB  0   Ectopic  0   Multiple  0   Live Births  1           Past Medical History:  Diagnosis Date   Migraines    STD (sexually transmitted disease)    chlamydia treated 2011, HSV 2   Trichomonal vaginitis 11/01/14   Vaginal Pap smear, abnormal     Past Surgical History:  Procedure Laterality Date   COLPOSCOPY  5/09   CINI    Family History  Problem Relation Age of Onset   Hypertension Maternal Grandmother    Healthy Mother    Healthy Father     Social History   Socioeconomic History   Marital status: Single    Spouse name: Not on file   Number of children: Not on file   Years of education: Not on file   Highest education level: Not on file  Occupational History   Not on file  Tobacco Use   Smoking status: Never   Smokeless tobacco: Never  Vaping Use   Vaping status: Some  Days  Substance and Sexual Activity   Alcohol use: Yes   Drug use: Not Currently    Types: Marijuana   Sexual activity: Yes    Partners: Male    Birth control/protection: None  Other Topics Concern   Not on file  Social History Narrative   ** Merged History Encounter **       Social Drivers of Corporate investment banker Strain: Not on file  Food Insecurity: Not on file  Transportation Needs: Not on file  Physical Activity: Not on file  Stress: Not on file  Social Connections: Not on file  Intimate Partner Violence: Not on file    Allergies  Allergen Reactions   Metronidazole  Itching, Rash and Hives   Amoxicillin Hives   Penicillins Hives    Did it involve swelling of the face/tongue/throat, SOB, or low BP? no Did it involve sudden or severe rash/hives, skin peeling, or any reaction on the inside of your mouth or nose? yes Did you need to seek medical attention at a hospital or doctor's office? Yes  When did it last happen?       If all above answers are "NO", may proceed with cephalosporin use.     No current facility-administered medications on file prior to encounter.   Current Outpatient Medications on File Prior to Encounter  Medication Sig Dispense Refill   acetaminophen  (TYLENOL ) 500 MG tablet Take 1,000 mg by mouth every 6 (six) hours as needed.     Prenatal Vit-Fe Fumarate-FA (MULTIVITAMIN-PRENATAL) 27-0.8 MG TABS tablet Take 1 tablet by mouth daily at 12 noon.       ROS Pertinent positives and negative per HPI, all others reviewed and negative  Physical Exam   BP 111/73 (BP Location: Right Arm)   Pulse 91   Temp 98.6 F (37 C) (Oral)   Resp 16   Ht 5\' 3"  (1.6 m)   Wt 59.1 kg   LMP 07/05/2023 (Approximate)   SpO2 98%   BMI 23.10 kg/m   Patient Vitals for the past 24 hrs:  BP Temp Temp src Pulse Resp SpO2 Height Weight  09/19/23 1414 111/73 98.6 F (37 C) Oral 91 16 98 % 5\' 3"  (1.6 m) 59.1 kg    Physical Exam Vitals reviewed.   Constitutional:      General: She is not in acute distress.    Appearance: She is well-developed. She is not diaphoretic.  Eyes:     General: No scleral icterus. Pulmonary:     Effort: Pulmonary effort is normal. No respiratory distress.  Abdominal:     General: There is no distension.     Palpations: Abdomen is soft.     Tenderness: There is no abdominal tenderness. There is no guarding or rebound.  Skin:    General: Skin is warm and dry.  Neurological:     Mental Status: She is alert.     Coordination: Coordination normal.      Cervical Exam    Bedside Ultrasound Pt informed that the ultrasound is considered a limited OB ultrasound and is not intended to be a complete ultrasound exam.  Patient also informed that the ultrasound is not being completed with the intent of assessing for fetal or placental anomalies or any pelvic abnormalities.  Explained that the purpose of today's ultrasound is to assess for  viability.  Patient acknowledges the purpose of the exam and the limitations of the study.      My interpretation: First trimester findings: Intrauterine gestational sac seen: yes Gestational sac summary: fetal pole seen, yolk sac seen, fetal cardiac activity Fetal cardiac activity: present, 171 bpm by M mode   Labs No results found for this or any previous visit (from the past 24 hours).  Imaging No results found.  MAU Course  Procedures Lab Orders         Urinalysis, Routine w reflex microscopic -Urine, Clean Catch    Meds ordered this encounter  Medications   cyclobenzaprine  (FLEXERIL ) 10 MG tablet    Sig: Take 1 tablet (10 mg total) by mouth 2 (two) times daily as needed for muscle spasms.    Dispense:  20 tablet    Refill:  0   Imaging Orders  No imaging studies ordered today    MDM Moderate (Level 3-4)  Assessment and Plan  #Pelvic pain in pregnancy, first trimester #[redacted] weeks gestation of pregnancy Persistent pelvic cramping. Unremarkable bedside  US  with normal FHR. Reassured patient. Suspect previously seen [facility] is the etiology of her cramps. Discussed return precautions in detail.   #Headache in pregnancy  Likely tension, trial flexeril .     Dispo: discharged to home in stable condition    Teena Feast, MD/MPH 09/19/23 3:41 PM  Allergies as of 09/19/2023       Reactions   Metronidazole  Itching, Rash, Hives   Amoxicillin Hives   Penicillins Hives   Did it involve swelling of the face/tongue/throat, SOB, or low BP? no Did it involve sudden or severe rash/hives, skin peeling, or any reaction on the inside of your mouth or nose? yes Did you need to seek medical attention at a hospital or doctor's office? Yes When did it last happen?       If all above answers are "NO", may proceed with cephalosporin use.        Medication List     TAKE these medications    acetaminophen  500 MG tablet Commonly known as: TYLENOL  Take 1,000 mg by mouth every 6 (six) hours as needed.   cyclobenzaprine  10 MG tablet Commonly known as: FLEXERIL  Take 1 tablet (10 mg total) by mouth 2 (two) times daily as needed for muscle spasms.   multivitamin-prenatal 27-0.8 MG Tabs tablet Take 1 tablet by mouth daily at 12 noon.

## 2023-10-21 ENCOUNTER — Inpatient Hospital Stay (HOSPITAL_COMMUNITY)
Admission: AD | Admit: 2023-10-21 | Discharge: 2023-10-21 | Disposition: A | Discharge status: Home / Self Care | Attending: Obstetrics and Gynecology | Admitting: Obstetrics and Gynecology

## 2023-10-21 ENCOUNTER — Other Ambulatory Visit: Payer: Self-pay

## 2023-10-21 ENCOUNTER — Encounter (HOSPITAL_COMMUNITY): Payer: Self-pay | Admitting: Obstetrics and Gynecology

## 2023-10-21 DIAGNOSIS — O99891 Other specified diseases and conditions complicating pregnancy: Secondary | ICD-10-CM | POA: Diagnosis not present

## 2023-10-21 DIAGNOSIS — R102 Pelvic and perineal pain: Secondary | ICD-10-CM | POA: Diagnosis present

## 2023-10-21 DIAGNOSIS — M7918 Myalgia, other site: Secondary | ICD-10-CM | POA: Diagnosis not present

## 2023-10-21 DIAGNOSIS — R262 Difficulty in walking, not elsewhere classified: Secondary | ICD-10-CM | POA: Diagnosis present

## 2023-10-21 DIAGNOSIS — Z3A15 15 weeks gestation of pregnancy: Secondary | ICD-10-CM | POA: Diagnosis not present

## 2023-10-21 DIAGNOSIS — Z113 Encounter for screening for infections with a predominantly sexual mode of transmission: Secondary | ICD-10-CM | POA: Diagnosis present

## 2023-10-21 LAB — WET PREP, GENITAL
Clue Cells Wet Prep HPF POC: NONE SEEN
Sperm: NONE SEEN
Trich, Wet Prep: NONE SEEN
WBC, Wet Prep HPF POC: >=10 — AB (ref <10)
Yeast Wet Prep HPF POC: NONE SEEN

## 2023-10-21 LAB — URINALYSIS, ROUTINE W REFLEX MICROSCOPIC
Bilirubin Urine: NEGATIVE
Glucose, UA: NEGATIVE mg/dL
Hgb urine dipstick: NEGATIVE
Ketones, ur: NEGATIVE mg/dL
Leukocytes,Ua: NEGATIVE
Nitrite: NEGATIVE
Protein, ur: NEGATIVE mg/dL
Specific Gravity, Urine: 1.006 (ref 1.005–1.030)
pH: 7 (ref 5.0–8.0)

## 2023-10-21 MED ORDER — CYCLOBENZAPRINE HCL 5 MG PO TABS
10.0000 mg | ORAL_TABLET | Freq: Once | ORAL | Status: AC
  Administered 2023-10-21: 10 mg via ORAL
  Filled 2023-10-21: qty 2

## 2023-10-21 MED ORDER — ACETAMINOPHEN 500 MG PO TABS
1000.0000 mg | ORAL_TABLET | Freq: Once | ORAL | Status: AC
  Administered 2023-10-21: 1000 mg via ORAL
  Filled 2023-10-21: qty 2

## 2023-10-21 NOTE — Discharge Instructions (Signed)
Follow up with your OB.

## 2023-10-21 NOTE — MAU Provider Note (Signed)
 S [name] is a 33 y.o. [id number] patient who presents to MAU today with complaint of lower pelvic and vagina discomfort. Denies dysuria, vaginal itching but notices some vaginal burning with urination. Denies constipation and offers no c/o LOF, VB, or lower abdominal cramping    O BP 101/72 (BP Location: Right Arm)   Pulse 88   Temp 98.6 F (37 C) (Oral)   Resp 18   Ht 5' 4 (1.626 m)   Wt 57.6 kg   LMP 07/05/2023 (Approximate)   SpO2 100%   BMI 21.78 kg/m  Physical Exam Vitals and nursing note reviewed. Exam conducted with a chaperone present.  Constitutional:      Appearance: Normal appearance.  HENT:     Head: Normocephalic.     Nose: Nose normal.     Mouth/Throat:     Mouth: Mucous membranes are moist.   Cardiovascular:     Rate and Rhythm: Normal rate.  Pulmonary:     Effort: Pulmonary effort is normal.  Abdominal:     Palpations: Abdomen is soft.   Musculoskeletal:        General: Normal range of motion.     Cervical back: Normal range of motion.   Skin:    General: Skin is warm.   Neurological:     Mental Status: She is alert and oriented to person, place, and time.   Psychiatric:        Mood and Affect: Mood normal.        Behavior: Behavior normal.     Constitutional: Well-developed, well-nourished female in no acute distress.  Cardiovascular: normal rate & rhythm, warm and well-perfused Respiratory: normal effort, no problems with respiration noted GI: Abd soft, non-tender, gravid MS: Extremities nontender, no edema, normal ROM Neurologic: Alert and oriented x 4.  GU: no CVA tenderness Pelvic: Exam chaperoned by Everardo Hitch (CMT)  No pooling, physiological discharge present, no evidence of blood in the vaginal vault and cervix is visually closed   FHR @ 150 via Doppler   MDM  HIGH  - U/A: R/O infection (Neg) - Vaginal swabs obtained:  to r/o infection (Wet prep negative, GC pending at discharge) - Tylenol /Flexeril  ordered  for likely muscular skeletal pain in pregnancy ( Patient reported relief with medication administered) - No evidence of infection at this time Will Plan for discharge home with Flexeril  prn   Orders Placed This Encounter  Procedures   Wet prep, genital    Standing Status:   Standing    Number of Occurrences:   1   Urinalysis, Routine w reflex microscopic -Urine, Clean Catch    Standing Status:   Standing    Number of Occurrences:   1    Specimen Source:   Urine, Clean Catch [76]   Discharge patient Discharge disposition: 01-Home or Self Care; Discharge patient date: 10/21/2023    Standing Status:   Standing    Number of Occurrences:   1    Discharge disposition:   01-Home or Self Care [1]    Discharge patient date:   10/21/2023      Results for orders placed or performed during the hospital encounter of 10/21/23 (from the past 24 hours)  Urinalysis, Routine w reflex microscopic -Urine, Clean Catch     Status: None   Collection Time: 10/21/23 10:31 AM  Result Value Ref Range   Color, Urine YELLOW YELLOW   APPearance CLEAR CLEAR   Specific Gravity, Urine 1.006 1.005 - 1.030  pH 7.0 5.0 - 8.0   Glucose, UA NEGATIVE NEGATIVE mg/dL   Hgb urine dipstick NEGATIVE NEGATIVE   Bilirubin Urine NEGATIVE NEGATIVE   Ketones, ur NEGATIVE NEGATIVE mg/dL   Protein, ur NEGATIVE NEGATIVE mg/dL   Nitrite NEGATIVE NEGATIVE   Leukocytes,Ua NEGATIVE NEGATIVE  Wet prep, genital     Status: Abnormal   Collection Time: 10/21/23 11:10 AM   Specimen: PATH Cytology Cervicovaginal Ancillary Only  Result Value Ref Range   Yeast Wet Prep HPF POC NONE SEEN NONE SEEN   Trich, Wet Prep NONE SEEN NONE SEEN   Clue Cells Wet Prep HPF POC NONE SEEN NONE SEEN   WBC, Wet Prep HPF POC >=10 (A) <10   Sperm NONE SEEN       I have reviewed the patient chart and performed the physical exam . I have ordered & interpreted the lab results and reviewed them with the patient.  A/P as described below.  Counseling and  education provided and patient agreeable  with plan as described below. Verbalized understanding.    ASSESSMENT Medical screening exam complete 1. Generalized muscular abdominal pain (Primary)  2. [redacted] weeks gestation of pregnancy    PLAN  Discharge from MAU in stable condition  Follow up with your OB   See AVS for full description of educational information and instructions provided to the patient at time of discharge  Warning signs for worsening condition that would warrant emergency follow-up discussed Patient may return to MAU as needed   Cherlynn Cornfield, NP 10/21/2023 1:33 PM

## 2023-10-21 NOTE — MAU Note (Signed)
 Teresa Velez is a 33 y.o. at [redacted]w[redacted]d here in MAU reporting: she's been having pelvic pain for the past few weeks.  Reports pain was initially intermittent, but now it's more constant.  States pain is causing difficulty walking and hurts when lying down, feels like pressure sensation in pelvis and between thighs.  Denies VB or LOF.  States has taken Tylenol  for pain, pain is relieved but returns.  States last took Tylenol  on Wednesday.  LMP: 07/05/2023 Onset of complaint: Few weeks (Pain began after intercourse) Pain score: 10 Vitals:   10/21/23 1027  BP: 114/74  Pulse: 88  Resp: 18  Temp: 98.4 F (36.9 C)  SpO2: 100%     FHT: 150 bpm  Lab orders placed from triage: UA

## 2023-10-24 ENCOUNTER — Other Ambulatory Visit: Payer: Self-pay | Admitting: Certified Nurse Midwife

## 2023-10-24 LAB — GC/CHLAMYDIA PROBE AMP (~~LOC~~) NOT AT ARMC
Chlamydia: NEGATIVE
Comment: NEGATIVE
Comment: NORMAL
Neisseria Gonorrhea: NEGATIVE

## 2023-10-24 MED ORDER — CYCLOBENZAPRINE HCL 10 MG PO TABS: 10.0000 mg | ORAL_TABLET | Freq: Three times a day (TID) | ORAL | 1 refills | Status: DC | PRN

## 2023-12-17 ENCOUNTER — Inpatient Hospital Stay (HOSPITAL_COMMUNITY)

## 2023-12-17 ENCOUNTER — Inpatient Hospital Stay (HOSPITAL_COMMUNITY)
Admission: AD | Admit: 2023-12-17 | Discharge: 2023-12-17 | Disposition: A | Discharge status: Home / Self Care | Attending: Obstetrics and Gynecology | Admitting: Obstetrics and Gynecology

## 2023-12-17 ENCOUNTER — Encounter (HOSPITAL_COMMUNITY): Payer: Self-pay | Admitting: Obstetrics and Gynecology

## 2023-12-17 DIAGNOSIS — R0602 Shortness of breath: Secondary | ICD-10-CM | POA: Diagnosis not present

## 2023-12-17 DIAGNOSIS — R7989 Other specified abnormal findings of blood chemistry: Secondary | ICD-10-CM | POA: Diagnosis not present

## 2023-12-17 DIAGNOSIS — R55 Syncope and collapse: Secondary | ICD-10-CM | POA: Diagnosis not present

## 2023-12-17 DIAGNOSIS — Z3A23 23 weeks gestation of pregnancy: Secondary | ICD-10-CM | POA: Diagnosis not present

## 2023-12-17 DIAGNOSIS — O26892 Other specified pregnancy related conditions, second trimester: Secondary | ICD-10-CM | POA: Diagnosis present

## 2023-12-17 DIAGNOSIS — R42 Dizziness and giddiness: Secondary | ICD-10-CM | POA: Diagnosis not present

## 2023-12-17 LAB — CBC
HCT: 35.1 % — ABNORMAL LOW (ref 36.0–46.0)
Hemoglobin: 12 g/dL (ref 12.0–15.0)
MCH: 30 pg (ref 26.0–34.0)
MCHC: 34.2 g/dL (ref 30.0–36.0)
MCV: 87.8 fL (ref 80.0–100.0)
Platelets: 276 K/uL (ref 150–400)
RBC: 4 MIL/uL (ref 3.87–5.11)
RDW: 13.7 % (ref 11.5–15.5)
WBC: 8.3 K/uL (ref 4.0–10.5)
nRBC: 0 % (ref 0.0–0.2)

## 2023-12-17 LAB — URINALYSIS, ROUTINE W REFLEX MICROSCOPIC
Bilirubin Urine: NEGATIVE
Glucose, UA: NEGATIVE mg/dL
Hgb urine dipstick: NEGATIVE
Ketones, ur: NEGATIVE mg/dL
Nitrite: NEGATIVE
Protein, ur: NEGATIVE mg/dL
Specific Gravity, Urine: 1.018 (ref 1.005–1.030)
pH: 5 (ref 5.0–8.0)

## 2023-12-17 LAB — D-DIMER, QUANTITATIVE: D-Dimer, Quant: 0.75 ug{FEU}/mL — ABNORMAL HIGH (ref 0.00–0.50)

## 2023-12-17 LAB — COMPREHENSIVE METABOLIC PANEL WITH GFR
ALT: 14 U/L (ref 0–44)
AST: 19 U/L (ref 15–41)
Albumin: 3.1 g/dL — ABNORMAL LOW (ref 3.5–5.0)
Alkaline Phosphatase: 40 U/L (ref 38–126)
Anion gap: 8 (ref 5–15)
BUN: 10 mg/dL (ref 6–20)
CO2: 21 mmol/L — ABNORMAL LOW (ref 22–32)
Calcium: 9.1 mg/dL (ref 8.9–10.3)
Chloride: 104 mmol/L (ref 98–111)
Creatinine, Ser: 0.67 mg/dL (ref 0.44–1.00)
GFR, Estimated: >60 mL/min (ref >60)
Glucose, Bld: 86 mg/dL (ref 70–99)
Potassium: 3.7 mmol/L (ref 3.5–5.1)
Sodium: 133 mmol/L — ABNORMAL LOW (ref 135–145)
Total Bilirubin: 0.3 mg/dL (ref 0.0–1.2)
Total Protein: 6.5 g/dL (ref 6.5–8.1)

## 2023-12-17 LAB — BRAIN NATRIURETIC PEPTIDE: B Natriuretic Peptide: 10.1 pg/mL (ref 0.0–100.0)

## 2023-12-17 MED ORDER — NITROFURANTOIN MONOHYD MACRO 100 MG PO CAPS
100.0000 mg | ORAL_CAPSULE | Freq: Two times a day (BID) | ORAL | 0 refills | Status: DC
Start: 2023-12-17 — End: 2023-12-18

## 2023-12-17 MED ORDER — IOHEXOL 350 MG/ML SOLN
75.0000 mL | Freq: Once | INTRAVENOUS | Status: AC | PRN
  Administered 2023-12-17: 75 mL via INTRAVENOUS

## 2023-12-17 MED ORDER — SCOPOLAMINE 1 MG/3DAYS TD PT72: 1.0000 | MEDICATED_PATCH | TRANSDERMAL | 12 refills | Status: DC

## 2023-12-17 NOTE — MAU Provider Note (Addendum)
 Chief Complaint:  Loss of Consciousness (Near fainting, feeling lightheaded for last few weeks)   HPI   None     Teresa Velez is a 33 y.o. [id number] at [redacted]w[redacted]d who presents to maternity admissions reporting lightheadedness. She reports lightheadedness started a few weeks ago, but it worsened today when she almost fainted while walking to the mailbox. She also reports SOB with light activity, improved with rest. Denies current SOB. She does not have current headache but sometimes gets headaches, Tylenol  resolves.  Pregnancy Course: Receives care at [facility]. Prenatal records reviewed.   Past Medical History:  Diagnosis Date   Migraines    STD (sexually transmitted disease)    chlamydia treated 2011, HSV 2   Trichomonal vaginitis 11/01/14   Vaginal Pap smear, abnormal    OB History  Gravida Para Term Preterm AB Living  4 1 1  0 2 1  SAB IAB Ectopic Multiple Live Births  1 1 0 0 1    # Outcome Date GA Lbr Len/2nd Weight Sex Type Anes PTL Lv  4 Current           3 Term 04/10/19 [redacted]w[redacted]d 01:27 / 00:12 2384 g F Vag-Spont None  LIV  2 IAB           1 SAB            Past Surgical History:  Procedure Laterality Date   COLPOSCOPY  5/09   CINI   Family History  Problem Relation Age of Onset   Hypertension Maternal Grandmother    Healthy Mother    Healthy Father    Social History   Tobacco Use   Smoking status: Former    Types: Cigars   Smokeless tobacco: Never  Vaping Use   Vaping status: Former  Substance Use Topics   Alcohol use: Not Currently   Drug use: Not Currently    Types: Marijuana   Allergies  Allergen Reactions   Metronidazole  Itching, Rash and Hives   Penicillins Hives    Did it involve swelling of the face/tongue/throat, SOB, or low BP? no  Did it involve sudden or severe rash/hives, skin peeling, or any reaction on the inside of your mouth or nose? yes  Did you need to seek medical attention at a hospital or doctor's office? Yes  When did it last happen?         If all above answers are NO, may proceed with cephalosporin use.  Did it involve swelling of the face/tongue/throat, SOB, or low BP? no  Did it involve sudden or severe rash/hives, skin peeling, or any reaction on the inside of your mouth or nose? yes  Did you need to seek medical attention at a hospital or doctor's office? Yes  When did it last happen?        If all above answers are NO, may proceed with cephalosporin use.   Amoxicillin Hives   Medications Prior to Admission  Medication Sig Dispense Refill Last Dose/Taking   acetaminophen  (TYLENOL ) 500 MG tablet Take 1,000 mg by mouth every 6 (six) hours as needed.   Past Week   Cholecalciferol 50 MCG (2000 UT) CAPS Take 1 capsule by mouth daily.   Taking   Prenatal Vit-Fe Fumarate-FA (MULTIVITAMIN-PRENATAL) 27-0.8 MG TABS tablet Take 1 tablet by mouth daily at 12 noon.   12/17/2023 Morning   cyclobenzaprine  (FLEXERIL ) 10 MG tablet Take 1 tablet (10 mg total) by mouth 2 (two) times daily as needed for muscle spasms. 20 tablet  0    cyclobenzaprine  (FLEXERIL ) 10 MG tablet Take 1 tablet (10 mg total) by mouth every 8 (eight) hours as needed for muscle spasms. 30 tablet 1     I have reviewed patient's Past Medical Hx, Surgical Hx, Family Hx, Social Hx, medications and allergies.   ROS  Pertinent items noted in HPI and remainder of comprehensive ROS otherwise negative.   PHYSICAL EXAM  Patient Vitals for the past 24 hrs:  BP Temp Temp src Pulse Resp SpO2  12/17/23 1748 125/77 -- -- 90 -- --  12/17/23 1747 113/71 -- -- 81 -- --  12/17/23 1746 120/73 -- -- 82 -- --  12/17/23 1742 108/71 -- -- 87 16 100 %  12/17/23 1735 -- -- -- -- -- 100 %  12/17/23 1730 -- -- -- -- -- 100 %  12/17/23 1725 -- -- -- -- -- 100 %  12/17/23 1720 -- -- -- -- -- 100 %  12/17/23 1715 -- -- -- -- -- 100 %  12/17/23 1710 -- -- -- -- -- 100 %  12/17/23 1705 -- -- -- -- -- 100 %  12/17/23 1700 -- -- -- -- -- 100 %  12/17/23 1655 -- -- -- -- -- 100 %   12/17/23 1650 -- -- -- -- -- 100 %  12/17/23 1645 -- -- -- -- -- 100 %  12/17/23 1644 118/77 98.4 F (36.9 C) Oral 83 16 100 %    Constitutional: Well-developed, well-nourished female in no acute distress.  HEENT: atraumatic, normocephalic. Neck has normal ROM. EOM intact. Cardiovascular: normal rate & rhythm. No murmurs, rubs, or gallops. Warm and well-perfused Respiratory: normal effort, no problems with respiration noted GI: Abd soft, non-tender, non-distended MSK: Extremities nontender, no edema, normal ROM Skin: warm and dry. Acyanotic, no jaundice or pallor. Neurologic: Alert and oriented x 4. No abnormal coordination. CNII-XII grossly intact.  Psychiatric: Normal mood. Speech not slurred, not rapid/pressured. Patient is cooperative. GU: no CVA tenderness  Fetal Tracing: Baseline FHR: 140 per minute Fetal heart variability: moderate Fetal Heart Rate accelerations: yes - 10x10 Fetal Heart Rate decelerations: none Fetal Non-stress Test: Category I (reactive) Toco: no uterine contractions   Labs: Results for orders placed or performed during the hospital encounter of 12/17/23 (from the past 24 hours)  Urinalysis, Routine w reflex microscopic -Urine, Clean Catch     Status: Abnormal   Collection Time: 12/17/23  4:34 PM  Result Value Ref Range   Color, Urine YELLOW YELLOW   APPearance HAZY (A) CLEAR   Specific Gravity, Urine 1.018 1.005 - 1.030   pH 5.0 5.0 - 8.0   Glucose, UA NEGATIVE NEGATIVE mg/dL   Hgb urine dipstick NEGATIVE NEGATIVE   Bilirubin Urine NEGATIVE NEGATIVE   Ketones, ur NEGATIVE NEGATIVE mg/dL   Protein, ur NEGATIVE NEGATIVE mg/dL   Nitrite NEGATIVE NEGATIVE   Leukocytes,Ua SMALL (A) NEGATIVE   RBC / HPF 0-5 0 - 5 RBC/hpf   WBC, UA 0-5 0 - 5 WBC/hpf   Bacteria, UA RARE (A) NONE SEEN   Squamous Epithelial / HPF 6-10 0 - 5 /HPF   Mucus PRESENT   CBC     Status: Abnormal   Collection Time: 12/17/23  4:41 PM  Result Value Ref Range   WBC 8.3 4.0 -  10.5 K/uL   RBC 4.00 3.87 - 5.11 MIL/uL   Hemoglobin 12.0 12.0 - 15.0 g/dL   HCT 64.8 (L) 63.9 - 53.9 %   MCV 87.8 80.0 - 100.0 fL  MCH 30.0 26.0 - 34.0 pg   MCHC 34.2 30.0 - 36.0 g/dL   RDW 86.2 88.4 - 84.4 %   Platelets 276 150 - 400 K/uL   nRBC 0.0 0.0 - 0.2 %  Comprehensive metabolic panel     Status: Abnormal   Collection Time: 12/17/23  4:41 PM  Result Value Ref Range   Sodium 133 (L) 135 - 145 mmol/L   Potassium 3.7 3.5 - 5.1 mmol/L   Chloride 104 98 - 111 mmol/L   CO2 21 (L) 22 - 32 mmol/L   Glucose, Bld 86 70 - 99 mg/dL   BUN 10 6 - 20 mg/dL   Creatinine, Ser 9.32 0.44 - 1.00 mg/dL   Calcium 9.1 8.9 - 89.6 mg/dL   Total Protein 6.5 6.5 - 8.1 g/dL   Albumin 3.1 (L) 3.5 - 5.0 g/dL   AST 19 15 - 41 U/L   ALT 14 0 - 44 U/L   Alkaline Phosphatase 40 38 - 126 U/L   Total Bilirubin 0.3 0.0 - 1.2 mg/dL   GFR, Estimated >39 >39 mL/min   Anion gap 8 5 - 15  Brain natriuretic peptide     Status: None   Collection Time: 12/17/23  6:03 PM  Result Value Ref Range   B Natriuretic Peptide 10.1 0.0 - 100.0 pg/mL  D-dimer, quantitative     Status: Abnormal   Collection Time: 12/17/23  6:03 PM  Result Value Ref Range   D-Dimer, Quant 0.75 (H) 0.00 - 0.50 ug/mL-FEU    Imaging:  CT Angio Chest Pulmonary Embolism (PE) W or WO Contrast Result Date: 12/17/2023 CLINICAL DATA:  PE suspected. Pregnant. Lightheaded. Near-syncope. Short of breath with activity. EXAM: CT ANGIOGRAPHY CHEST WITH CONTRAST TECHNIQUE: Multidetector CT imaging of the chest was performed using the standard protocol during bolus administration of intravenous contrast. Multiplanar CT image reconstructions and MIPs were obtained to evaluate the vascular anatomy. RADIATION DOSE REDUCTION: This exam was performed according to the departmental dose-optimization program which includes automated exposure control, adjustment of the mA and/or kV according to patient size and/or use of iterative reconstruction technique.  CONTRAST:  75mL OMNIPAQUE  IOHEXOL  350 MG/ML SOLN COMPARISON:  Chest radiograph 05/06/2012 FINDINGS: Cardiovascular: Negative for acute pulmonary embolism. Normal caliber thoracic aorta. No pericardial effusion. Mediastinum/Nodes: Trachea and esophagus are unremarkable. No thoracic adenopathy Lungs/Pleura: No focal consolidation, pleural effusion, or pneumothorax. Upper Abdomen: No acute abnormality. Musculoskeletal: No acute fracture. Review of the MIP images confirms the above findings. IMPRESSION: Negative for acute pulmonary embolism. No acute abnormality in the chest. Electronically Signed   By: Norman Gatlin M.D.   On: 12/17/2023 21:43    EKG: EKG: there are no previous tracings available for comparison, normal sinus rhythm. I personally reviewed for intrepretation.  Orthostatic vital signs:  MDM & MAU COURSE  MDM: High  MAU Course: -Vital signs within normal limits. -EKG to rule out arrhythmias. -CBC to rule out anemia. CMP for glucose, electrolytes, and renal/hepatic function. BNP to rule out heart failure/cardiomyopathy. D-dimer to rule out PE. -Orthostatic vital signs for possible orthostatic hypotension. -EKG with normal sinus rhythm. -Orthostatics within normal limits, no orthostatic hypotension. -CBC within normal limits, no anemia. -CMP without significant abnormalities. -BNP within normal limits. -UA has bacteria but no nitrites. Sending for culture, will treat bacteruria. -D-dimer elevated at 0.75. Pulmonary embolism not excluded, will proceed with CTPA. -Discussed risks and benefits of CTPA, patient is agreeable to moving forward with imaging. -Awaiting CTPA. Care handed over to [facility]  Stonewall Doss NP at 2110. - CT shows no acute abnormalities and negative for acute pulmonary embolism - No acute pathology identified at this time likely pregnancy related dyspnea and near syncopal episode.  Plan to follow-up with [facility] provider and consider cardiology consult.  Patient stable for  discharge at this time. FREDRIK Dalton, NP [phone]  Differential diagnosis considered for shortness of breath includes but is not limited to: physiologic SOB of pregnancy, URI, asthma, anemia, valvular heart disease, anxiety/panic disorder, ACS, heart failure exacerbation, pulmonary embolism  Differential diagnosis for presyncope includes but is not limited to: arrhythmia, vasovagal reaction, hypoglycemia,  BPPV  Orders Placed This Encounter  Procedures   Culture, OB Urine   CT Angio Chest Pulmonary Embolism (PE) W or WO Contrast   CBC   Urinalysis, Routine w reflex microscopic -Urine, Clean Catch   Comprehensive metabolic panel   Brain natriuretic peptide   D-dimer, quantitative   Orthostatic vital signs   ED EKG   Discharge patient Discharge disposition: 01-Home or Self Care; Discharge patient date: 12/17/2023   Meds ordered this encounter  Medications   nitrofurantoin , macrocrystal-monohydrate, (MACROBID ) 100 MG capsule    Sig: Take 1 capsule (100 mg total) by mouth 2 (two) times daily for 7 days.    Dispense:  14 capsule    Refill:  0   iohexol  (OMNIPAQUE ) 350 MG/ML injection 75 mL   scopolamine  (TRANSDERM-SCOP) 1 MG/3DAYS    Sig: Place 1 patch (1.5 mg total) onto the skin every 3 (three) days.    Dispense:  10 patch    Refill:  12    Supervising Provider:   PRATT, TANYA S [[id number]]    ASSESSMENT   1. Lightheadedness   2. SOB (shortness of breath) on exertion   3. Elevated d-dimer   4. [redacted] weeks gestation of pregnancy   5. Syncope, unspecified syncope type     PLAN  Discharge home in stable condition with return precautions.     Follow-up Information     [facility] Follow up.   Specialty: Obstetrics and Gynecology Why: As scheduled for ongoing prenatal care Contact information: [address] [place]  [address] [phone]                 Allergies as of 12/17/2023       Reactions   Metronidazole   Itching, Rash, Hives   Penicillins Hives   Did it involve swelling of the face/tongue/throat, SOB, or low BP? no Did it involve sudden or severe rash/hives, skin peeling, or any reaction on the inside of your mouth or nose? yes Did you need to seek medical attention at a hospital or doctor's office? Yes When did it last happen?       If all above answers are NO, may proceed with cephalosporin use. Did it involve swelling of the face/tongue/throat, SOB, or low BP? no  Did it involve sudden or severe rash/hives, skin peeling, or any reaction on the inside of your mouth or nose? yes  Did you need to seek medical attention at a hospital or doctor's office? Yes  When did it last happen?        If all above answers are NO, may proceed with cephalosporin use.   Amoxicillin Hives        Medication List     TAKE these medications    acetaminophen  500 MG tablet Commonly known as: TYLENOL  Take 1,000 mg by mouth every 6 (six) hours as needed.  Cholecalciferol 50 MCG (2000 UT) Caps Take 1 capsule by mouth daily.   cyclobenzaprine  10 MG tablet Commonly known as: FLEXERIL  Take 1 tablet (10 mg total) by mouth 2 (two) times daily as needed for muscle spasms.   cyclobenzaprine  10 MG tablet Commonly known as: FLEXERIL  Take 1 tablet (10 mg total) by mouth every 8 (eight) hours as needed for muscle spasms.   multivitamin-prenatal 27-0.8 MG Tabs tablet Take 1 tablet by mouth daily at 12 noon.   nitrofurantoin  (macrocrystal-monohydrate) 100 MG capsule Commonly known as: MACROBID  Take 1 capsule (100 mg total) by mouth 2 (two) times daily for 7 days.   scopolamine  1 MG/3DAYS Commonly known as: TRANSDERM-SCOP Place 1 patch (1.5 mg total) onto the skin every 3 (three) days.        Olam DELENA Dalton, NP  Olam Dalton, MSN, [facility], [facility] [employer]

## 2023-12-17 NOTE — MAU Note (Signed)
..  Teresa Velez is a 33 y.o. at [redacted]w[redacted]d here in MAU reporting: lightheaded for the last few weeks. Got worse today when she was going out to the mailbox she almost fainted. Gets short of breath with light activity. Not currently short of breath.  Has also had an on again off again headache throughout pregnancy. Feels sharp spurts of pain (8/10). Not currently having a headache.   Tylenol  helps with headache. Hasn't taken tylenol  in last few days because she was concerned she was taking too much medication.   Pain score: no pain Vitals:   12/17/23 1644  BP: 118/77  Pulse: 83  Resp: 16  Temp: 98.4 F (36.9 C)  SpO2: 100%     FHT:145 Lab orders placed from triage:

## 2023-12-17 NOTE — Discharge Instructions (Signed)
 Follow-up with your OB provider at [facility].

## 2023-12-18 ENCOUNTER — Other Ambulatory Visit: Payer: Self-pay | Admitting: Obstetrics and Gynecology

## 2023-12-18 LAB — CULTURE, OB URINE: Culture: NO GROWTH

## 2023-12-18 MED ORDER — NITROFURANTOIN MONOHYD MACRO 100 MG PO CAPS: 100.0000 mg | ORAL_CAPSULE | Freq: Two times a day (BID) | ORAL | 0 refills | Status: AC

## 2023-12-18 NOTE — Progress Notes (Signed)
 [facility] out-of-stock for Macrobid . Resent to [facility].

## 2024-01-31 LAB — OB RESULTS CONSOLE HIV ANTIBODY (ROUTINE TESTING): HIV: NONREACTIVE

## 2024-02-08 NOTE — Progress Notes (Signed)
 Teresa Velez is a 33 y.o. female c/o dry cough, loss of voice, congestion started a week ago.

## 2024-02-08 NOTE — Progress Notes (Signed)
 Subjective Patient ID: Teresa Velez is a 33 y.o. female.    Patient is a pleasant 33 year old female presenting with nasal congestion, dry cough, as well as scratchy throat for approximately 1 week, this seems to be slightly transient symptomatology.  Patient reports her pre-k daughter also has similar symptoms.  Patient denies any medical history, no fevers, no headaches, no dyspnea, no any unilateral or bilateral edema.  Patient reports being currently 7 months gestation.   History provided by:  Patient Language interpreter used: No     Review of Systems  Constitutional: Negative.   HENT:  Positive for congestion, rhinorrhea and sinus pressure. Negative for dental problem, drooling, ear discharge, ear pain, facial swelling, hearing loss, mouth sores, nosebleeds, postnasal drip, sinus pain, sneezing, sore throat, tinnitus, trouble swallowing and voice change.   Eyes: Negative.   Respiratory: Negative.    Cardiovascular: Negative.   Gastrointestinal: Negative.   Endocrine: Negative.   Genitourinary: Negative.   Musculoskeletal: Negative.   Skin: Negative.   Allergic/Immunologic: Negative.   Neurological: Negative.   Hematological: Negative.   Psychiatric/Behavioral: Negative.      Patient History  Allergies: Allergies  Allergen Reactions  . Metronidazole  Hives, Itching and Rash  . Penicillins Hives    Did it involve swelling of the face/tongue/throat, SOB, or low BP? no Did it involve sudden or severe rash/hives, skin peeling, or any reaction on the inside of your mouth or nose? yes Did you need to seek medical attention at a hospital or doctor's office? Yes When did it last happen?       If all above answers are NO, may proceed with cephalosporin use.     History reviewed. No pertinent past medical history. History reviewed. No pertinent surgical history. Social History   Socioeconomic History  . Marital status: Single    Spouse name: Not on file  . Number of  children: Not on file  . Years of education: Not on file  . Highest education level: Not on file  Occupational History  . Not on file  Tobacco Use  . Smoking status: Never  . Smokeless tobacco: Never  Substance and Sexual Activity  . Alcohol use: Not Currently  . Drug use: Not on file  . Sexual activity: Yes  Other Topics Concern  . Not on file  Social History Narrative  . Not on file   No family history on file. Current Outpatient Medications on File Prior to Visit  Medication Sig Dispense Refill  . multivitamin (Prenatal) 27-0.8 MG tablet Take 1 tablet by mouth.    . Prenatal Vit-Fe Phos-FA-Omega (Vitafol Gummies) 3.33-0.333-34.8 MG chewable tablet CHEW 3 TABLETS BY MOUTH EVERY DAY     No current facility-administered medications on file prior to visit.    Objective  Vitals:   02/08/24 1252  BP: 123/72  BP Location: Left arm  Patient Position: Sitting  Pulse: 100  Resp: 20  Temp: 36.8 C (98.2 F)  TempSrc: Oral  SpO2: 99%  Weight: 75.3 kg  Height: 5' 6  PainSc: 0-No pain        OBGYN/Pregnancy Status: Pregnant      No results found.  Physical Exam Constitutional:      Appearance: Normal appearance. She is normal weight.  HENT:     Head: Normocephalic.     Jaw: There is normal jaw occlusion. No trismus, tenderness, swelling, pain on movement or malocclusion.     Salivary Glands: Right salivary gland is not diffusely enlarged or tender. Left  salivary gland is not diffusely enlarged or tender.     Right Ear: Hearing, tympanic membrane, ear canal and external ear normal.     Left Ear: Hearing, tympanic membrane, ear canal and external ear normal.     Nose: Nose normal.     Right Turbinates: Not enlarged, swollen or pale.     Left Turbinates: Not enlarged, swollen or pale.     Right Sinus: No maxillary sinus tenderness or frontal sinus tenderness.     Left Sinus: No maxillary sinus tenderness or frontal sinus tenderness.     Mouth/Throat:     Lips:  Pink. No lesions.     Mouth: Mucous membranes are moist.     Pharynx: Oropharynx is clear. Uvula midline. No pharyngeal swelling, oropharyngeal exudate, posterior oropharyngeal erythema, uvula swelling or postnasal drip.  Eyes:     Extraocular Movements: Extraocular movements intact.     Conjunctiva/sclera: Conjunctivae normal.  Cardiovascular:     Rate and Rhythm: Normal rate and regular rhythm.     Pulses: Normal pulses.     Heart sounds: Normal heart sounds.  Pulmonary:     Effort: Pulmonary effort is normal.     Breath sounds: Normal breath sounds.  Musculoskeletal:        General: Normal range of motion.     Cervical back: Full passive range of motion without pain, normal range of motion and neck supple. No spinous process tenderness or muscular tenderness.  Skin:    General: Skin is warm and dry.     Capillary Refill: Capillary refill takes less than 2 seconds.  Neurological:     General: No focal deficit present.     Mental Status: She is alert and oriented to person, place, and time. Mental status is at baseline.  Psychiatric:        Mood and Affect: Mood normal.        Behavior: Behavior is cooperative.        Thought Content: Thought content normal.        Judgment: Judgment normal.     No results found for this visit on 02/08/24.     Procedures MDM:     1 Acute complicated illness or injury     Explanation of Medical Decision Making and variances from expected care:  Patient is a pleasant 33 year old female presenting to the clinic today for reports of nasal congestion, itchy throat, as well as dry cough for approximately 1 week.  Patient reports pre-k daughter with similar symptoms, and also reports being 7 months gestation.  Patient's exam was unremarkable, with patient being nontoxic.  Entirely patent oropharynx without evidence of edema, erythema, nor exudate.  No appreciable lymphadenopathy.  No sinus region tenderness.  Even unlabored respirations with clear  lung sounds in all fields.  I recommended patient follow-up with OB for suggested OTC medication, otherwise, continued monitoring for symptoms requiring emergent reevaluation.   Assessment requiring historian other than patient: No     Independent visualization of image, tracing, or test: No     Discussion of management with another provider: No     Risk:: Moderate            Assessment/Plan Diagnoses and all orders for this visit:  Viral URI  Cough with congestion of paranasal sinus -     POCT QuickVue Antigen Test        There are no Patient Instructions on file for this visit.  Progress note signed by Fonda Flavors, NP on  02/08/24 at  1:16 PM

## 2024-03-07 ENCOUNTER — Encounter (HOSPITAL_COMMUNITY): Payer: Self-pay | Admitting: Obstetrics and Gynecology

## 2024-03-07 ENCOUNTER — Inpatient Hospital Stay (HOSPITAL_COMMUNITY)
Admission: AD | Admit: 2024-03-07 | Discharge: 2024-03-07 | Disposition: A | Discharge status: Home / Self Care | Attending: Obstetrics and Gynecology | Admitting: Obstetrics and Gynecology

## 2024-03-07 DIAGNOSIS — O26893 Other specified pregnancy related conditions, third trimester: Secondary | ICD-10-CM | POA: Diagnosis not present

## 2024-03-07 DIAGNOSIS — R519 Headache, unspecified: Secondary | ICD-10-CM

## 2024-03-07 DIAGNOSIS — R0600 Dyspnea, unspecified: Secondary | ICD-10-CM | POA: Diagnosis not present

## 2024-03-07 DIAGNOSIS — Z3689 Encounter for other specified antenatal screening: Secondary | ICD-10-CM

## 2024-03-07 DIAGNOSIS — Z3A35 35 weeks gestation of pregnancy: Secondary | ICD-10-CM

## 2024-03-07 DIAGNOSIS — R102 Pelvic and perineal pain unspecified side: Secondary | ICD-10-CM

## 2024-03-07 DIAGNOSIS — O368330 Maternal care for abnormalities of the fetal heart rate or rhythm, third trimester, not applicable or unspecified: Secondary | ICD-10-CM | POA: Insufficient documentation

## 2024-03-07 DIAGNOSIS — O26899 Other specified pregnancy related conditions, unspecified trimester: Secondary | ICD-10-CM

## 2024-03-07 LAB — COMPREHENSIVE METABOLIC PANEL WITH GFR
ALT: 16 U/L (ref 0–44)
AST: 21 U/L (ref 15–41)
Albumin: 2.9 g/dL — ABNORMAL LOW (ref 3.5–5.0)
Alkaline Phosphatase: 98 U/L (ref 38–126)
Anion gap: 11 (ref 5–15)
BUN: 6 mg/dL (ref 6–20)
CO2: 19 mmol/L — ABNORMAL LOW (ref 22–32)
Calcium: 8.7 mg/dL — ABNORMAL LOW (ref 8.9–10.3)
Chloride: 102 mmol/L (ref 98–111)
Creatinine, Ser: 0.55 mg/dL (ref 0.44–1.00)
GFR, Estimated: >60 mL/min (ref >60)
Glucose, Bld: 84 mg/dL (ref 70–99)
Potassium: 3.4 mmol/L — ABNORMAL LOW (ref 3.5–5.1)
Sodium: 132 mmol/L — ABNORMAL LOW (ref 135–145)
Total Bilirubin: 0.5 mg/dL (ref 0.0–1.2)
Total Protein: 6.7 g/dL (ref 6.5–8.1)

## 2024-03-07 LAB — URINALYSIS, ROUTINE W REFLEX MICROSCOPIC
Bilirubin Urine: NEGATIVE
Glucose, UA: NEGATIVE mg/dL
Hgb urine dipstick: NEGATIVE
Ketones, ur: NEGATIVE mg/dL
Leukocytes,Ua: NEGATIVE
Nitrite: NEGATIVE
Protein, ur: NEGATIVE mg/dL
Specific Gravity, Urine: 1.004 — ABNORMAL LOW (ref 1.005–1.030)
pH: 7 (ref 5.0–8.0)

## 2024-03-07 LAB — CBC
HCT: 36.2 % (ref 36.0–46.0)
Hemoglobin: 12 g/dL (ref 12.0–15.0)
MCH: 28.5 pg (ref 26.0–34.0)
MCHC: 33.1 g/dL (ref 30.0–36.0)
MCV: 86 fL (ref 80.0–100.0)
Platelets: 267 K/uL (ref 150–400)
RBC: 4.21 MIL/uL (ref 3.87–5.11)
RDW: 13.9 % (ref 11.5–15.5)
WBC: 7.2 K/uL (ref 4.0–10.5)
nRBC: 0 % (ref 0.0–0.2)

## 2024-03-07 LAB — WET PREP, GENITAL
Clue Cells Wet Prep HPF POC: NONE SEEN
Sperm: NONE SEEN
Trich, Wet Prep: NONE SEEN
WBC, Wet Prep HPF POC: >=10 — AB (ref <10)
Yeast Wet Prep HPF POC: NONE SEEN

## 2024-03-07 MED ORDER — LACTATED RINGERS IV BOLUS
1000.0000 mL | Freq: Once | INTRAVENOUS | Status: AC
  Administered 2024-03-07: 1000 mL via INTRAVENOUS

## 2024-03-07 MED ORDER — LACTATED RINGERS IV SOLN: INTRAVENOUS | Status: DC

## 2024-03-07 MED ORDER — ACETAMINOPHEN-CAFFEINE 500-65 MG PO TABS
2.0000 | ORAL_TABLET | Freq: Once | ORAL | Status: AC
  Administered 2024-03-07: 2 via ORAL
  Filled 2024-03-07: qty 2

## 2024-03-07 MED ORDER — ONDANSETRON HCL 4 MG/2ML IJ SOLN
4.0000 mg | Freq: Once | INTRAMUSCULAR | Status: AC
Start: 2024-03-07 — End: 2024-03-07
  Administered 2024-03-07: 4 mg via INTRAVENOUS
  Filled 2024-03-07: qty 2

## 2024-03-07 NOTE — MAU Note (Signed)
 Teresa Velez is a 33 y.o. at [redacted]w[redacted]d here in MAU reporting: pelvic pain pressure/ctx (only when she is standing and walking). Feeling SOB and having chest pain and discomfort and c/o headache.  Has not taken anything for the headache. Denies any vag bleeding or leaking. Stated baby has not been moving much today   LMP:  Onset of complaint: today  Pain score: 7 Vitals:   03/07/24 1801  BP: 115/76  Pulse: 97  Resp: 18  Temp: 98 F (36.7 C)  SpO2: 100%     FHT: 138  Lab orders placed from triage: u/a

## 2024-03-07 NOTE — MAU Provider Note (Signed)
 Chief Complaint:  Contractions and Shortness of Breath   HPI   Teresa Velez is a 33 y.o. [id number] at [redacted]w[redacted]d who presents to maternity admissions reporting that she has been experiencing pelvic pain and pressure/contractions when she has been walking and standing.  Patient states she feels short of breath when walking and has been having some chest pain and discomfort while walking with fatigue.  She also reports a headache that has not resolved with Tylenol  in the past therefore she did not take any medication today for the headache.  HA 7/10 on pain scale .  She denies any vaginal bleeding, leaking of fluid and reports that the baby has been moving but not as much today.   @ 1815 I was called personally to the bedside due to initial fetal bradycardia when nurses putting the patient on the monitor. Orders were immediately placed for IVF Hydration and patient was repositioned and FHRT improved immediately   Pregnancy Course: [facility]  Past Medical History:  Diagnosis Date   Migraines    STD (sexually transmitted disease)    chlamydia treated 2011, HSV 2   Trichomonal vaginitis 11/01/14   Vaginal Pap smear, abnormal    OB History  Gravida Para Term Preterm AB Living  4 1 1  0 2 1  SAB IAB Ectopic Multiple Live Births  1 1 0 0 1    # Outcome Date GA Lbr Len/2nd Weight Sex Type Anes PTL Lv  4 Current           3 Term 04/10/19 [redacted]w[redacted]d 01:27 / 00:12 2384 g F Vag-Spont None  LIV  2 IAB           1 SAB            Past Surgical History:  Procedure Laterality Date   COLPOSCOPY  5/09   CINI   Family History  Problem Relation Age of Onset   Hypertension Maternal Grandmother    Healthy Mother    Healthy Father    Social History   Tobacco Use   Smoking status: Former    Types: Cigars   Smokeless tobacco: Never  Vaping Use   Vaping status: Former  Substance Use Topics   Alcohol use: Not Currently   Drug use: Not Currently    Types: Marijuana   Allergies  Allergen Reactions    Metronidazole  Itching, Rash and Hives   Penicillins Hives    Did it involve swelling of the face/tongue/throat, SOB, or low BP? no  Did it involve sudden or severe rash/hives, skin peeling, or any reaction on the inside of your mouth or nose? yes  Did you need to seek medical attention at a hospital or doctor's office? Yes  When did it last happen?        If all above answers are NO, may proceed with cephalosporin use.  Did it involve swelling of the face/tongue/throat, SOB, or low BP? no  Did it involve sudden or severe rash/hives, skin peeling, or any reaction on the inside of your mouth or nose? yes  Did you need to seek medical attention at a hospital or doctor's office? Yes  When did it last happen?        If all above answers are NO, may proceed with cephalosporin use.   Amoxicillin Hives   Medications Prior to Admission  Medication Sig Dispense Refill Last Dose/Taking   Cholecalciferol 50 MCG (2000 UT) CAPS Take 1 capsule by mouth daily.   03/07/2024  Prenatal Vit-Fe Fumarate-FA (MULTIVITAMIN-PRENATAL) 27-0.8 MG TABS tablet Take 1 tablet by mouth daily at 12 noon.   03/07/2024   scopolamine  (TRANSDERM-SCOP) 1 MG/3DAYS Place 1 patch (1.5 mg total) onto the skin every 3 (three) days. 10 patch 12 03/06/2024   acetaminophen  (TYLENOL ) 500 MG tablet Take 1,000 mg by mouth every 6 (six) hours as needed.      cyclobenzaprine  (FLEXERIL ) 10 MG tablet Take 1 tablet (10 mg total) by mouth 2 (two) times daily as needed for muscle spasms. 20 tablet 0    cyclobenzaprine  (FLEXERIL ) 10 MG tablet Take 1 tablet (10 mg total) by mouth every 8 (eight) hours as needed for muscle spasms. 30 tablet 1     I have reviewed patient's Past Medical Hx, Surgical Hx, Family Hx, Social Hx, medications and allergies.   ROS  Pertinent items noted in HPI and remainder of comprehensive ROS otherwise negative.   PHYSICAL EXAM  Patient Vitals for the past 24 hrs:  BP Temp Pulse Resp SpO2 Height Weight   03/07/24 1801 115/76 98 F (36.7 C) 97 18 100 % 5' 3 (1.6 m) 78.9 kg    Constitutional: Well-developed, well-nourished female in no acute distress.  Cardiovascular: normal rate & rhythm, warm and well-perfused Respiratory: normal effort, no problems with respiration noted GI: Abd soft, non-tender, gravid MS: Extremities nontender, no edema, normal ROM Neurologic: Alert and oriented x 4.  GU: no CVA tenderness Pelvic: Chaperoned by Nat Lacy RN  SVE: Closed   Fetal Tracing: NST Reactive ( NST prolonged x 1 hour) Baseline: 120-125 Variability:moderate  Accelerations: present Decelerations: ( Initial bradycardia to the 90's with return to baseline of 120 after interventions of hydration and positional change and NST was reactive with prolonged monitoring x 1 hour) Toco: occasional    Labs: Results for orders placed or performed during the hospital encounter of 03/07/24 (from the past 24 hours)  CBC     Status: None   Collection Time: 03/07/24  6:31 PM  Result Value Ref Range   WBC 7.2 4.0 - 10.5 K/uL   RBC 4.21 3.87 - 5.11 MIL/uL   Hemoglobin 12.0 12.0 - 15.0 g/dL   HCT 63.7 63.9 - 53.9 %   MCV 86.0 80.0 - 100.0 fL   MCH 28.5 26.0 - 34.0 pg   MCHC 33.1 30.0 - 36.0 g/dL   RDW 86.0 88.4 - 84.4 %   Platelets 267 150 - 400 K/uL   nRBC 0.0 0.0 - 0.2 %  Comprehensive metabolic panel     Status: Abnormal   Collection Time: 03/07/24  6:31 PM  Result Value Ref Range   Sodium 132 (L) 135 - 145 mmol/L   Potassium 3.4 (L) 3.5 - 5.1 mmol/L   Chloride 102 98 - 111 mmol/L   CO2 19 (L) 22 - 32 mmol/L   Glucose, Bld 84 70 - 99 mg/dL   BUN 6 6 - 20 mg/dL   Creatinine, Ser 9.44 0.44 - 1.00 mg/dL   Calcium 8.7 (L) 8.9 - 10.3 mg/dL   Total Protein 6.7 6.5 - 8.1 g/dL   Albumin 2.9 (L) 3.5 - 5.0 g/dL   AST 21 15 - 41 U/L   ALT 16 0 - 44 U/L   Alkaline Phosphatase 98 38 - 126 U/L   Total Bilirubin 0.5 0.0 - 1.2 mg/dL   GFR, Estimated >39 >39 mL/min   Anion gap 11 5 - 15  Wet  prep, genital     Status: Abnormal   Collection  Time: 03/07/24  7:30 PM  Result Value Ref Range   Yeast Wet Prep HPF POC NONE SEEN NONE SEEN   Trich, Wet Prep NONE SEEN NONE SEEN   Clue Cells Wet Prep HPF POC NONE SEEN NONE SEEN   WBC, Wet Prep HPF POC >=10 (A) <10   Sperm NONE SEEN       MDM & MAU COURSE  MDM:  HIGH  Prenatal chart reviewed Physical exam with pelvic Vaginal swabs obtained ( GC Pending at discharge)  Wet  prep negative Cervical exam Closed ( No evidence of active labor) CBC: NM CMP: Potassium at 3.4 UA: Pending at discharge d/t patient's inability to void initially  IV fluids Excedrin and Zofran  for headache with resolve (patient now reporting headache 0 out of 10) EKG : Normal sinus Rhythm   Patient reports all her symptoms are now resolved and she is requesting discharge and reports she has a f/u appt at [facility] on 11/10  MAU Course: Orders Placed This Encounter  Procedures   Wet prep, genital   CBC   Comprehensive metabolic panel   Urinalysis, Routine w reflex microscopic -Urine, Random   EKG 12-Lead   Discharge patient Discharge disposition: 01-Home or Self Care; Discharge patient date: 03/07/2024   Meds ordered this encounter  Medications   lactated ringers  bolus 1,000 mL   acetaminophen -caffeine (EXCEDRIN TENSION HEADACHE) 500-65 MG per tablet 2 tablet   ondansetron  (ZOFRAN ) injection 4 mg   lactated ringers  infusion    I have reviewed the patient chart and performed the physical exam . I have ordered & interpreted the lab results and reviewed and interpreted the NST Medications ordered as stated below.  A/P as described below.  Counseling and education provided and patient agreeable  with plan as described below. Verbalized understanding.    ASSESSMENT   1. Nonintractable headache, unspecified chronicity pattern, unspecified headache type   2. [redacted] weeks gestation of pregnancy   3. NST (non-stress test) reactive on fetal surveillance   4.  Dyspnea, unspecified type   5. Pelvic pressure in pregnancy     PLAN  Discharge home in stable condition with return precautions.   F/U 03/12/24 with OB Provider   See AVS for full description of information given to the patient including both verbal and written. Patient verbalized understanding and agrees with the plan as described above.     Follow-up Information     Ob/[facility], [facility] Follow up.   Specialty: Obstetrics and Gynecology Why: If symptoms worsen or fail to resolve, As scheduled for ongoing prenatal care Contact information: [address] [place] [address] [phone]                 Allergies as of 03/07/2024       Reactions   Metronidazole  Itching, Rash, Hives   Penicillins Hives   Did it involve swelling of the face/tongue/throat, SOB, or low BP? no Did it involve sudden or severe rash/hives, skin peeling, or any reaction on the inside of your mouth or nose? yes Did you need to seek medical attention at a hospital or doctor's office? Yes When did it last happen?       If all above answers are NO, may proceed with cephalosporin use. Did it involve swelling of the face/tongue/throat, SOB, or low BP? no  Did it involve sudden or severe rash/hives, skin peeling, or any reaction on the inside of your mouth or nose? yes  Did you need to seek  medical attention at a hospital or doctor's office? Yes  When did it last happen?        If all above answers are NO, may proceed with cephalosporin use.   Amoxicillin Hives        Medication List     TAKE these medications    acetaminophen  500 MG tablet Commonly known as: TYLENOL  Take 1,000 mg by mouth every 6 (six) hours as needed.   Cholecalciferol 50 MCG (2000 UT) Caps Take 1 capsule by mouth daily.   cyclobenzaprine  10 MG tablet Commonly known as: FLEXERIL  Take 1 tablet (10 mg total) by mouth 2 (two) times daily as needed for muscle spasms.   cyclobenzaprine  10 MG  tablet Commonly known as: FLEXERIL  Take 1 tablet (10 mg total) by mouth every 8 (eight) hours as needed for muscle spasms.   multivitamin-prenatal 27-0.8 MG Tabs tablet Take 1 tablet by mouth daily at 12 noon.   scopolamine  1 MG/3DAYS Commonly known as: TRANSDERM-SCOP Place 1 patch (1.5 mg total) onto the skin every 3 (three) days.        Olam Dalton, MSN, [facility], [facility]    This chart was dictated using voice recognition software, Dragon. Despite the best efforts of this provider to proofread and correct errors, errors may still occur which can change documentation meaning.

## 2024-03-07 NOTE — Discharge Instructions (Signed)

## 2024-03-08 LAB — GC/CHLAMYDIA PROBE AMP (~~LOC~~) NOT AT ARMC
Chlamydia: NEGATIVE
Comment: NEGATIVE
Comment: NORMAL
Neisseria Gonorrhea: NEGATIVE

## 2024-03-14 ENCOUNTER — Other Ambulatory Visit: Payer: Self-pay | Admitting: *Deleted

## 2024-03-19 ENCOUNTER — Telehealth: Payer: Self-pay

## 2024-03-19 ENCOUNTER — Ambulatory Visit

## 2024-03-19 DIAGNOSIS — Z3689 Encounter for other specified antenatal screening: Secondary | ICD-10-CM

## 2024-03-19 DIAGNOSIS — Z3480 Encounter for supervision of other normal pregnancy, unspecified trimester: Secondary | ICD-10-CM | POA: Insufficient documentation

## 2024-03-19 MED ORDER — BLOOD PRESSURE KIT DEVI: 1.0000 | 0 refills | Status: DC | PRN

## 2024-03-19 MED ORDER — GOJJI WEIGHT SCALE MISC: 1.0000 | 0 refills | Status: DC | PRN

## 2024-03-19 NOTE — Progress Notes (Unsigned)
..  New OB Intake  I connected with Teresa Velez  on 03/19/24 at  2:00 PM EST by telephone and verified that I am speaking with the correct person using two identifiers. Nurse is located at [facility] and pt is located at home. The patient is a transfer from [facility] at 35.4 weeks.  I discussed the limitations, risks, security and privacy concerns of performing an evaluation and management service by telephone and the availability of in person appointments. I also discussed with the patient that there may be a patient responsible charge related to this service. The patient expressed understanding and agreed to proceed.  I explained I am completing New OB Intake today. We discussed EDD of 04/19/24 based on US  at 6 weeks. Pt is [id number]. I reviewed her allergies, medications and Medical/Surgical/OB history.    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 Discussed the nature of our practice with multiple providers including residents and students as well as female and female providers. Due to the size of the practice, the delivering provider may not be the same as those providing prenatal care.   Patient is a transfer at 35.4 weeks so n/a for water birth.  MyChart/Babyscripts MyChart access verified. I explained pt will have some visits in office and some virtually. Babyscripts instructions given and order placed. Patient verifies receipt of registration text/e-mail. Account successfully created and app downloaded. If patient is a candidate for Optimized scheduling, add to sticky note.   Blood  Pressure Cuff/Weight Scale Blood pressure cuff ordered for patient to pick-up from [employer]. Explained after first prenatal appt pt will check weekly and document in Babyscripts. Patient does not have weight scale; order sent to [facility], patient may track weight weekly in Babyscripts.  Anatomy US  Anatomy US  was performed on 11/24/23 with [facility]  Is patient a CenteringPregnancy candidate?  {Accepted:19197::Accepted,Not a Candidate,Declined} Declined due to {Declined:19197::Schedule,Childcare,Group setting,Support person concern,Declined to say,Enrolled in [facility],***} Not a candidate due to {Not a Candidate:19197::DM,CHTN, medication controlled,Language barrier,>28 weeks,Multiple gestation (mono-mono or mono-di),Complex coordination of care needed,***} If accepted,    Is patient a Mom+Baby Combined Care candidate?  {Accepted:19197::Accepted,Declined,Not a candidate,***}   If accepted, confirm patient does not intend to move from the area for at least 12 months, then notify Mom+Baby staff  Is patient a candidate for Babyscripts Optimization? {babyscripts:31704}   First visit review I reviewed new OB appt with patient. Explained pt will be seen by *** at first visit. Routine prenatal labs Were already collected at previous OB office   Last Pap No results found for: DIAGPAP  Laymon JONETTA Crigler, RN 03/19/2024  2:26 PM

## 2024-03-21 ENCOUNTER — Encounter: Admitting: Physician Assistant

## 2024-03-21 DIAGNOSIS — Z3480 Encounter for supervision of other normal pregnancy, unspecified trimester: Secondary | ICD-10-CM

## 2024-04-04 LAB — OB RESULTS CONSOLE GBS: GBS: NEGATIVE

## 2024-04-13 ENCOUNTER — Inpatient Hospital Stay (HOSPITAL_COMMUNITY)
Admission: AD | Admit: 2024-04-13 | Discharge: 2024-04-15 | DRG: 806 | Disposition: A | Attending: Obstetrics and Gynecology | Admitting: Obstetrics and Gynecology

## 2024-04-13 ENCOUNTER — Encounter (HOSPITAL_COMMUNITY): Payer: Self-pay | Admitting: Obstetrics and Gynecology

## 2024-04-13 DIAGNOSIS — O9832 Other infections with a predominantly sexual mode of transmission complicating childbirth: Secondary | ICD-10-CM | POA: Diagnosis present

## 2024-04-13 DIAGNOSIS — O26893 Other specified pregnancy related conditions, third trimester: Secondary | ICD-10-CM | POA: Diagnosis present

## 2024-04-13 DIAGNOSIS — Z8249 Family history of ischemic heart disease and other diseases of the circulatory system: Secondary | ICD-10-CM

## 2024-04-13 DIAGNOSIS — Z87891 Personal history of nicotine dependence: Secondary | ICD-10-CM

## 2024-04-13 DIAGNOSIS — Z3A39 39 weeks gestation of pregnancy: Secondary | ICD-10-CM | POA: Diagnosis not present

## 2024-04-13 DIAGNOSIS — A6000 Herpesviral infection of urogenital system, unspecified: Secondary | ICD-10-CM | POA: Diagnosis present

## 2024-04-13 DIAGNOSIS — O9902 Anemia complicating childbirth: Secondary | ICD-10-CM | POA: Diagnosis present

## 2024-04-13 LAB — POCT FERN TEST: POCT Fern Test: POSITIVE

## 2024-04-13 MED ORDER — OXYTOCIN-SODIUM CHLORIDE 30-0.9 UT/500ML-% IV SOLN
2.5000 [IU]/h | INTRAVENOUS | Status: DC
  Filled 2024-04-13: qty 500

## 2024-04-13 MED ORDER — LIDOCAINE HCL (PF) 1 % IJ SOLN: 30.0000 mL | INTRAMUSCULAR | Status: DC | PRN

## 2024-04-13 MED ORDER — FENTANYL CITRATE (PF) 100 MCG/2ML IJ SOLN
50.0000 ug | INTRAMUSCULAR | Status: DC | PRN
  Administered 2024-04-14: 100 ug via INTRAVENOUS
  Filled 2024-04-13: qty 2

## 2024-04-13 MED ORDER — ACETAMINOPHEN 325 MG PO TABS: 650.0000 mg | ORAL_TABLET | ORAL | Status: DC | PRN

## 2024-04-13 MED ORDER — SOD CITRATE-CITRIC ACID 500-334 MG/5ML PO SOLN: 30.0000 mL | ORAL | Status: DC | PRN

## 2024-04-13 MED ORDER — LACTATED RINGERS IV SOLN: INTRAVENOUS | Status: DC

## 2024-04-13 MED ORDER — ONDANSETRON HCL 4 MG/2ML IJ SOLN: 4.0000 mg | Freq: Four times a day (QID) | INTRAMUSCULAR | Status: DC | PRN

## 2024-04-13 MED ORDER — OXYTOCIN BOLUS FROM INFUSION
333.0000 mL | Freq: Once | INTRAVENOUS | Status: AC
  Administered 2024-04-14: 333 mL via INTRAVENOUS

## 2024-04-13 MED ORDER — LACTATED RINGERS IV SOLN
500.0000 mL | INTRAVENOUS | Status: DC | PRN
  Administered 2024-04-14: 1000 mL via INTRAVENOUS

## 2024-04-13 NOTE — MAU Note (Signed)
 Pt says UC's strong since 430pm. PNC- [facility] VE last week- soft. HX- HSV-  - never an outbreak- not taking Valtrex. Denies any S/S of an outbreak.  GBS- neg

## 2024-04-14 ENCOUNTER — Encounter (HOSPITAL_COMMUNITY): Payer: Self-pay | Admitting: Obstetrics and Gynecology

## 2024-04-14 ENCOUNTER — Inpatient Hospital Stay (HOSPITAL_COMMUNITY): Admitting: Anesthesiology

## 2024-04-14 LAB — CBC
HCT: 35.6 % — ABNORMAL LOW (ref 36.0–46.0)
Hemoglobin: 11.9 g/dL — ABNORMAL LOW (ref 12.0–15.0)
MCH: 28.4 pg (ref 26.0–34.0)
MCHC: 33.4 g/dL (ref 30.0–36.0)
MCV: 85 fL (ref 80.0–100.0)
Platelets: 300 K/uL (ref 150–400)
RBC: 4.19 MIL/uL (ref 3.87–5.11)
RDW: 15.1 % (ref 11.5–15.5)
WBC: 8.3 K/uL (ref 4.0–10.5)
nRBC: 0 % (ref 0.0–0.2)

## 2024-04-14 LAB — TYPE AND SCREEN
ABO/RH(D): O POS
Antibody Screen: NEGATIVE

## 2024-04-14 LAB — SYPHILIS: RPR W/REFLEX TO RPR TITER AND TREPONEMAL ANTIBODIES, TRADITIONAL SCREENING AND DIAGNOSIS ALGORITHM: RPR Ser Ql: NONREACTIVE

## 2024-04-14 MED ORDER — EPHEDRINE 5 MG/ML INJ: 10.0000 mg | INTRAVENOUS | Status: DC | PRN

## 2024-04-14 MED ORDER — LACTATED RINGERS IV SOLN: 500.0000 mL | Freq: Once | INTRAVENOUS | Status: DC

## 2024-04-14 MED ORDER — DIPHENHYDRAMINE HCL 50 MG/ML IJ SOLN: 12.5000 mg | INTRAMUSCULAR | Status: DC | PRN

## 2024-04-14 MED ORDER — SIMETHICONE 80 MG PO CHEW: 80.0000 mg | CHEWABLE_TABLET | ORAL | Status: DC | PRN

## 2024-04-14 MED ORDER — SENNOSIDES-DOCUSATE SODIUM 8.6-50 MG PO TABS: 2.0000 | ORAL_TABLET | ORAL | Status: DC

## 2024-04-14 MED ORDER — PHENYLEPHRINE 80 MCG/ML (10ML) SYRINGE FOR IV PUSH (FOR BLOOD PRESSURE SUPPORT): 80.0000 ug | PREFILLED_SYRINGE | INTRAVENOUS | Status: DC | PRN

## 2024-04-14 MED ORDER — BENZOCAINE-MENTHOL 20-0.5 % EX AERO: 1.0000 | INHALATION_SPRAY | CUTANEOUS | Status: DC | PRN

## 2024-04-14 MED ORDER — LIDOCAINE HCL (PF) 1 % IJ SOLN
INTRAMUSCULAR | Status: DC | PRN
  Administered 2024-04-14: 3 mL via SUBCUTANEOUS

## 2024-04-14 MED ORDER — SODIUM CHLORIDE 0.9 % IV SOLN
INTRAVENOUS | Status: DC | PRN
  Administered 2024-04-14: 8 mL via EPIDURAL

## 2024-04-14 MED ORDER — ONDANSETRON HCL 4 MG/2ML IJ SOLN: 4.0000 mg | INTRAMUSCULAR | Status: DC | PRN

## 2024-04-14 MED ORDER — ONDANSETRON HCL 4 MG PO TABS: 4.0000 mg | ORAL_TABLET | ORAL | Status: DC | PRN

## 2024-04-14 MED ORDER — TETANUS-DIPHTH-ACELL PERTUSSIS 5-2-15.5 LF-MCG/0.5 IM SUSP: 0.5000 mL | Freq: Once | INTRAMUSCULAR | Status: DC

## 2024-04-14 MED ORDER — PRENATAL MULTIVITAMIN CH
1.0000 | ORAL_TABLET | Freq: Every day | ORAL | Status: DC
  Administered 2024-04-15: 1 via ORAL
  Filled 2024-04-14: qty 1

## 2024-04-14 MED ORDER — LIDOCAINE-EPINEPHRINE (PF) 2 %-1:200000 IJ SOLN
INTRAMUSCULAR | Status: DC | PRN
  Administered 2024-04-14: 3 mL via EPIDURAL

## 2024-04-14 MED ORDER — WITCH HAZEL-GLYCERIN EX PADS: 1.0000 | MEDICATED_PAD | CUTANEOUS | Status: DC | PRN

## 2024-04-14 MED ORDER — PHENYLEPHRINE 80 MCG/ML (10ML) SYRINGE FOR IV PUSH (FOR BLOOD PRESSURE SUPPORT)
80.0000 ug | PREFILLED_SYRINGE | INTRAVENOUS | Status: DC | PRN
  Filled 2024-04-14: qty 10

## 2024-04-14 MED ORDER — IBUPROFEN 600 MG PO TABS
600.0000 mg | ORAL_TABLET | Freq: Four times a day (QID) | ORAL | Status: DC
  Administered 2024-04-14 – 2024-04-15 (×5): 600 mg via ORAL
  Filled 2024-04-14 (×5): qty 1

## 2024-04-14 MED ORDER — COCONUT OIL OIL: 1.0000 | TOPICAL_OIL | Status: DC | PRN

## 2024-04-14 MED ORDER — DIPHENHYDRAMINE HCL 25 MG PO CAPS: 25.0000 mg | ORAL_CAPSULE | Freq: Four times a day (QID) | ORAL | Status: DC | PRN

## 2024-04-14 MED ORDER — FENTANYL-BUPIVACAINE-NACL 0.5-0.125-0.9 MG/250ML-% EP SOLN
EPIDURAL | Status: AC
  Filled 2024-04-14: qty 250

## 2024-04-14 MED ORDER — DIBUCAINE (PERIANAL) 1 % EX OINT: 1.0000 | TOPICAL_OINTMENT | CUTANEOUS | Status: DC | PRN

## 2024-04-14 MED ORDER — FENTANYL-BUPIVACAINE-NACL 0.5-0.125-0.9 MG/250ML-% EP SOLN
12.0000 mL/h | EPIDURAL | Status: DC | PRN
  Administered 2024-04-14: 12 mL/h via EPIDURAL

## 2024-04-14 MED ORDER — ACETAMINOPHEN 325 MG PO TABS
650.0000 mg | ORAL_TABLET | Freq: Four times a day (QID) | ORAL | Status: DC
  Administered 2024-04-14 – 2024-04-15 (×5): 650 mg via ORAL
  Filled 2024-04-14 (×5): qty 2

## 2024-04-14 NOTE — Progress Notes (Signed)
 Subjective:    Feels pressure and an urge to push. This sensation has been present for 2 hours, but anterior lip persists and cannot be reduced. Pt has used nitrous and IV sedation to manage discomfort. Discussed position changes to facilitate rotation into OA position and an epidural for relaxation as pt has not slept in >20 hrs.   Objective:    VS: BP 125/77   Pulse 80   Temp 97.7 F (36.5 C)   Resp 17   Ht 5' 3 (1.6 m)   Wt 85 kg   LMP 07/05/2023 (Approximate)   BMI 33.21 kg/m  FHR: baseline 125 / variability moderate / accelerations present / absent decelerations Toco: contractions every 3-4 minutes Membranes: SROM x6 hrs, clear Dilation: 9 Effacement (%): 90 Station: 0 Presentation: Vertex Exam by:: Teresa Velez CNM  Assessment/Plan:   33 y.o. [id number] [redacted]w[redacted]d Spontaneous labor  Labor: protracted active phase, may augment w/ Pitocin  if no change after epidural Fetal Wellbeing:  Category I Pain Control:  Epidural, IV pain meds, and Nitrous Oxide I/D:  GBS neg Anticipated MOD:  NSVD  Teresa Velez Teresa Ruggiero DNP, CNM 04/14/2024 3:25 AM

## 2024-04-14 NOTE — Anesthesia Preprocedure Evaluation (Signed)
 Anesthesia Evaluation  Patient identified by MRN, date of birth, ID band Patient awake    Reviewed: Allergy & Precautions, NPO status , Patient's Chart, lab work & pertinent test results  History of Anesthesia Complications Negative for: history of anesthetic complications  Airway Mallampati: II  TM Distance: >3 FB Neck ROM: Full    Dental no notable dental hx. (+) Teeth Intact   Pulmonary neg pulmonary ROS, neg sleep apnea, neg COPD, Patient abstained from smoking.Not current smoker, former smoker   Pulmonary exam normal breath sounds clear to auscultation       Cardiovascular Exercise Tolerance: Good METS(-) hypertension(-) CAD and (-) Past MI negative cardio ROS (-) dysrhythmias  Rhythm:Regular Rate:Normal - Systolic murmurs    Neuro/Psych  Headaches  negative psych ROS   GI/Hepatic ,neg GERD  ,,(+)     (-) substance abuse    Endo/Other  neg diabetes    Renal/GU negative Renal ROS     Musculoskeletal   Abdominal  (+) + obese  Peds  Hematology  (+) Blood dyscrasia, anemia Denies blood thinner use or bleeding disorders.    Anesthesia Other Findings Denies blood thinner use or bleeding diatheses. Recent labs reviewed. Past Medical History: 01/26/2021: Low grade squamous intraepithelial lesion (LGSIL) on  cervicovaginal cytologic smear     Comment:  2022, normal cytology 10/2022   No date: Migraines No date: STD (sexually transmitted disease)     Comment:  chlamydia treated 2011, HSV 2 11/01/2014: Trichomonal vaginitis No date: Vaginal Pap smear, abnormal   Reproductive/Obstetrics (+) Pregnancy                              Anesthesia Physical Anesthesia Plan  ASA: 2  Anesthesia Plan: Epidural   Post-op Pain Management:    Induction:   PONV Risk Score and Plan: 2 and Treatment may vary due to age or medical condition and Ondansetron   Airway Management Planned: Natural  Airway  Additional Equipment:   Intra-op Plan:   Post-operative Plan:   Informed Consent: I have reviewed the patients History and Physical, chart, labs and discussed the procedure including the risks, benefits and alternatives for the proposed anesthesia with the patient or authorized representative who has indicated his/her understanding and acceptance.       Plan Discussed with: Surgeon  Anesthesia Plan Comments: (Discussed R/B/A of neuraxial anesthesia technique with patient: - rare risks of spinal/epidural hematoma, nerve damage, infection - Risk of PDPH - Risk of itching - Risk of nausea and vomiting - Risk of poor block necessitating replacement of epidural. - Risk of allergic reactions. Patient voiced understanding.)         Anesthesia Quick Evaluation

## 2024-04-14 NOTE — H&P (Cosign Needed)
 OB ADMISSION/ HISTORY & PHYSICAL:  Admission Date: 04/13/2024  7:23 PM  Admit Diagnosis: Normal labor  Teresa Velez is a 33 y.o. female [phone] [redacted]w[redacted]d presenting for labor eval. Endorses active FM, denies LOF and vaginal bleeding. Ctx began @ 0200 on 04/13/24, worsened at 1630. SROM occurred in MAU. Hx of IUGR w/ prev birth in 2020, EFW in 34%ile at 34 wks for this pregnancy.   Hx of HSV2, denies any recent outbreaks and prodromal symptoms. Compliant w/ suppression therapy since 36 wks, arrived to L&D too far dilated for speculum exam. No lesions noted on vulva and surroundings areas.   History of current pregnancy: [id number]   Patient entered care with [facility] at 6+5 wks.   Anatomy scan: complete w/ posterior placenta.   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    Prenatal Labs: ABO, Rh: --/--/O POS (12/12 2129) Antibody: NEG (12/12 2129) Rubella:   immune RPR:   NR HBsAg:   NR HIV:   NR GTT: normal 1 hr GBS:   negative GC/CHL: neg/neg Genetics: low-risk female Vaccines: Tdap: declined Influenza: declined   OB History  Gravida Para Term Preterm AB Living  4 1 1  0 2 1  SAB IAB Ectopic Multiple Live Births  1 1 0 0 1    # Outcome Date GA Lbr Len/2nd Weight Sex Type Anes PTL Lv  4 Current           3 Term 04/10/19 [redacted]w[redacted]d 01:27 / 00:12 2384 g F Vag-Spont None  LIV  2 IAB           1 SAB             Medical / Surgical History: Past medical history:  Past Medical History:  Diagnosis Date   Migraines    STD (sexually transmitted disease)    chlamydia treated 2011, HSV 2   Trichomonal vaginitis 11/01/14   Vaginal Pap  smear, abnormal     Past surgical history:  Past Surgical History:  Procedure Laterality Date   COLPOSCOPY  5/09   CINI   Family History:  Family History  Problem Relation Age of Onset   Hypertension Maternal Grandmother    Healthy Mother    Healthy Father     Social History:  reports that she has quit smoking. Her smoking use included cigars. She has never used smokeless tobacco. She reports that she does not currently use alcohol. She reports that she does not use drugs.  Allergies: Metronidazole , Penicillins, and Amoxicillin   Current Medications at time of admission:  Prior to Admission medications  Medication Sig Start Date End Date Taking? Authorizing Provider  Prenatal Vit-Fe Fumarate-FA (MULTIVITAMIN-PRENATAL) 27-0.8 MG TABS tablet Take 1 tablet by mouth daily at 12 noon.   Yes [provider]  acetaminophen  (TYLENOL ) 500 MG tablet Take 1,000 mg by mouth every 6 (six) hours as needed.    [provider]  Blood Pressure Monitoring (BLOOD PRESSURE KIT) DEVI 1 kit by Does not apply route as needed. 03/19/24   Ajewole, Christana, MD  Cholecalciferol 50 MCG (2000 UT) CAPS Take 1 capsule by mouth daily. 09/02/23   [provider]  cyclobenzaprine  (FLEXERIL ) 10 MG tablet Take 1 tablet (10 mg total) by mouth 2 (two) times daily as needed for muscle spasms. 09/19/23   Lola Donnice HERO, MD  cyclobenzaprine  (FLEXERIL ) 10 MG tablet Take 1 tablet (10 mg total) by mouth every 8 (eight) hours as needed for muscle spasms. 10/24/23   Emilio Delilah HERO, CNM  Misc. Devices (GOJJI WEIGHT SCALE) MISC 1 Device by Does not apply route as needed. 03/19/24   Ajewole, Christana, MD  scopolamine  (TRANSDERM-SCOP) 1 MG/3DAYS Place 1 patch (1.5 mg total) onto the skin every 3 (three) days. 12/17/23   Littie Olam LABOR, NP    Review of Systems: Constitutional: Negative   HENT: Negative   Eyes: Negative   Respiratory: Negative   Cardiovascular: Negative   Gastrointestinal:  Negative  Genitourinary: neg for bloody show, neg for LOF   Musculoskeletal: Negative   Skin: Negative   Neurological: Negative   Endo/Heme/Allergies: Negative   Psychiatric/Behavioral: Negative    Physical Exam: VS: Blood pressure 136/69, pulse 91, temperature 98.1 F (36.7 C), temperature source Oral, resp. rate 14, height 5' 3 (1.6 m), weight 85 kg, last menstrual period 07/05/2023, unknown if currently breastfeeding. AAO x3, no signs of distress Cardiovascular: RRR Respiratory: Unlabored GU/GI: Abdomen gravid, non-tender, non-distended, active FM, vertex  Extremities: trace edema, negative for pain, tenderness, and cords  Cervical exam:Dilation: 9 Effacement (%): 90 Station: 0 Exam by:: Mercer Peal CNM FHR: baseline rate 135 / variability moderate / accelerations present / early decelerations TOCO: 2-3   Prenatal Transfer Tool  Maternal Diabetes: No Genetic Screening: Normal Maternal Ultrasounds/Referrals: Normal Fetal Ultrasounds or other Referrals:  None Maternal Substance Abuse:  No Significant Maternal Medications:  Meds include: Other: Valtrex Significant Maternal Lab Results: Group B Strep negative Number of Prenatal Visits:greater than 3 verified prenatal visits Maternal Vaccinations: declined Other Comments:  None    Assessment: 33 y.o. [id number] [redacted]w[redacted]d  Active stage of labor FHR category 1 GBS neg Pain management plan: nitrous, IV sedation, hydrotherapy   Plan:  Admit to L&D Routine admission orders Epidural PRN Expectant management Intermittent EFM for hydrotherapy Dr Henry notified of admission and plan of care  Mercer KATHEE Peal DNP, CNM 04/14/2024 1:08 AM

## 2024-04-14 NOTE — Anesthesia Procedure Notes (Signed)
 Epidural Patient location during procedure: OB Start time: 04/14/2024 3:50 AM End time: 04/14/2024 3:53 AM  Staffing Anesthesiologist: Boone Fess, MD Performed: anesthesiologist   Preanesthetic Checklist Completed: patient identified, IV checked, site marked, risks and benefits discussed, surgical consent, monitors and equipment checked, pre-op evaluation and timeout performed  Epidural Patient position: sitting Prep: ChloraPrep Patient monitoring: heart rate, continuous pulse ox and blood pressure Approach: midline Location: L4-L5 Injection technique: LOR saline  Needle:  Needle type: Tuohy  Needle gauge: 17 G Needle length: 9 cm Needle insertion depth: 7 cm Catheter type: closed end flexible Catheter size: 19 Gauge Catheter at skin depth: 12 cm Test dose: negative and 1.5% lidocaine  with Epi 1:200 K  Assessment Sensory level: T10 Events: blood not aspirated, no cerebrospinal fluid, injection not painful, no injection resistance, no paresthesia and negative IV test  Additional Notes First/one attempt Pt. Evaluated and documentation done after procedure finished. Patient identified. Risks/Benefits/Options discussed with patient including but not limited to bleeding, infection, nerve damage, paralysis, failed block, incomplete pain control, headache, blood pressure changes, nausea, vomiting, reactions to medication both or allergic, itching and postpartum back pain. Confirmed with bedside nurse the patient's most recent platelet count. Confirmed with patient that they are not currently taking any anticoagulation, have any bleeding history or any family history of bleeding disorders. Patient expressed understanding and wished to proceed. All questions were answered. Sterile technique was used throughout the entire procedure. Please see nursing notes for vital signs. Test dose was given through epidural catheter and negative prior to continuing to dose epidural or start infusion.  Warning signs of high block given to the patient including shortness of breath, tingling/numbness in hands, complete motor block, or any concerning symptoms with instructions to call for help. Patient was given instructions on fall risk and not to get out of bed. All questions and concerns addressed with instructions to call with any issues or inadequate analgesia.     Patient tolerated the insertion well without immediate complications.  Reason for block: procedure for painReason for block:procedure for pain

## 2024-04-14 NOTE — Lactation Note (Signed)
 This note was copied from a baby's chart. Lactation Consultation Note  Patient Name: Teresa Velez Unijb'd Date: 04/14/2024 Age:34 hours Reason for consult: Initial assessment  P2, Mother states she had difficulty latching her first child and tried for one month. Baby cueing. Reviewed hand expression with drops expressed. Baby latched first in cross cradle hold.  Mother has challenges with her IV and wrist strength so baby was repositioned to football hold. Reviewed how to compress breast to achieve a deep latch and support breast during feeding.  Baby sustained latch for 10 min.   During feeding mother started cramping and feeding was ended so mother could go to the restroom. RN called to assist.  Maternal Data Has patient been taught Hand Expression?: Yes Does the patient have breastfeeding experience prior to this delivery?: Yes How long did the patient breastfeed?: one month, difficulty latching first child  Feeding Mother's Current Feeding Choice: Breast Milk and Formula  LATCH Score Latch: Grasps breast easily, tongue down, lips flanged, rhythmical sucking.  Audible Swallowing: A few with stimulation  Type of Nipple: Everted at rest and after stimulation  Comfort (Breast/Nipple): Soft / non-tender  Hold (Positioning): Assistance needed to correctly position infant at breast and maintain latch.  LATCH Score: 8   Interventions Interventions: Breast feeding basics reviewed;Assisted with latch;Skin to skin;Hand express;Education;LC Services brochure;CDC milk storage guidelines  Discharge    Consult Status Consult Status: Follow-up Date: 04/15/24 Follow-up type: In-patient   Teresa Levorn Lemme  RN, IBCLC 04/14/2024, 11:44 AM

## 2024-04-15 LAB — CBC
HCT: 29.7 % — ABNORMAL LOW (ref 36.0–46.0)
Hemoglobin: 10.1 g/dL — ABNORMAL LOW (ref 12.0–15.0)
MCH: 28.5 pg (ref 26.0–34.0)
MCHC: 34 g/dL (ref 30.0–36.0)
MCV: 83.7 fL (ref 80.0–100.0)
Platelets: 235 K/uL (ref 150–400)
RBC: 3.55 MIL/uL — ABNORMAL LOW (ref 3.87–5.11)
RDW: 15.4 % (ref 11.5–15.5)
WBC: 12.2 K/uL — ABNORMAL HIGH (ref 4.0–10.5)
nRBC: 0 % (ref 0.0–0.2)

## 2024-04-15 MED ORDER — ACETAMINOPHEN 325 MG PO TABS: 650.0000 mg | ORAL_TABLET | Freq: Four times a day (QID) | ORAL | Status: AC

## 2024-04-15 MED ORDER — IBUPROFEN 600 MG PO TABS: 600.0000 mg | ORAL_TABLET | Freq: Four times a day (QID) | ORAL | 0 refills | Status: AC

## 2024-04-15 NOTE — Anesthesia Postprocedure Evaluation (Signed)
 Anesthesia Post Note  Patient: Teresa Velez  Procedure(s) Performed: AN AD HOC LABOR EPIDURAL     Patient location during evaluation: Mother Baby Anesthesia Type: Epidural Level of consciousness: awake, awake and alert and oriented Pain management: satisfactory to patient Vital Signs Assessment: post-procedure vital signs reviewed and stable Respiratory status: spontaneous breathing, nonlabored ventilation and respiratory function stable Cardiovascular status: blood pressure returned to baseline and stable Postop Assessment: no backache, no headache, patient able to bend at knees, no apparent nausea or vomiting, able to ambulate and adequate PO intake Anesthetic complications: no   No notable events documented.  Last Vitals:  Vitals:   04/14/24 2146 04/15/24 0631  BP: 109/60 106/64  Pulse: 86 82  Resp: 18 18  Temp: 36.8 C 36.8 C  SpO2: 99% 99%    Last Pain:  Vitals:   04/15/24 0631  TempSrc: Oral  PainSc:    Pain Goal: Patients Stated Pain Goal: 0 (04/14/24 1749)                 Bekka Qian

## 2024-04-15 NOTE — Discharge Summary (Signed)
 Postpartum Discharge Summary  Date of Service updated 04/15/24    Patient Name: Teresa Velez DOB: [birth year] MRN: [id number]  Date of admission: 04/13/2024 Delivery date:04/14/2024 Delivering provider: Jasin Brazel B Date of discharge: 04/15/2024  Admitting diagnosis: Normal labor [O80, Z37.9] Intrauterine pregnancy: [redacted]w[redacted]d     Secondary diagnosis:  Principal Problem:   Postpartum care following vaginal delivery Active Problems:   Normal labor  Additional problems: None    Discharge diagnosis: Term Pregnancy Delivered                                              Post partum procedures:None Augmentation: N/A Complications: None  Hospital course: Onset of Labor With Vaginal Delivery      33 y.o. yo [id number] at [redacted]w[redacted]d was admitted in Latent Labor on 04/13/2024. Labor course was complicated by protracted active phase  Membrane Rupture Time/Date: 9:16 PM,04/13/2024  Delivery Method:Vaginal, Spontaneous Operative Delivery:N/A Episiotomy: None Lacerations:  None Patient had a postpartum course was uncomplicated.  She is ambulating, tolerating a regular diet, passing flatus, and urinating well. Patient is discharged home in stable condition on 04/15/2024.  Newborn Data: Birth date:04/14/2024 Birth time:7:58 AM Gender:Female Living status:Living Apgars:9 ,9  Weight:3040 g  Magnesium Sulfate received: No BMZ received: No Rhophylac:N/A MMR:N/A T-DaP:declined Flu: No RSV Vaccine received: No Transfusion:No Immunizations administered: Immunization History  Administered Date(s) Administered   HPV Quadrivalent 09/04/2007, 11/07/2007, 04/30/2008   Influenza,inj,Quad PF,6+ Mos 04/12/2019   Tdap 09/06/2008, 04/12/2019    Physical exam  Vitals:   04/14/24 1034 04/14/24 1134 04/14/24 1530 04/14/24 2146  BP: 109/61 108/66 (!) 106/51 109/60  Pulse: 77 76 86 86  Resp: 18 18 17 18   Temp: 98 F (36.7 C) 97.9 F (36.6 C) 98.6 F (37 C) 98.2 F (36.8 C)  TempSrc:  Oral Oral Oral Oral  SpO2: 100% 100% 99% 99%  Weight:      Height:       General: alert, cooperative, and no distress Lochia: appropriate Uterine Fundus: firm Incision: N/A DVT Evaluation: No evidence of DVT seen on physical exam. No cords or calf tenderness. No significant calf/ankle edema. Labs: Lab Results  Component Value Date   WBC 8.3 04/13/2024   HGB 11.9 (L) 04/13/2024   HCT 35.6 (L) 04/13/2024   MCV 85.0 04/13/2024   PLT 300 04/13/2024      Latest Ref Rng & Units 03/07/2024    6:31 PM  CMP  Glucose 70 - 99 mg/dL 84   BUN 6 - 20 mg/dL 6   Creatinine 9.55 - 8.99 mg/dL 9.44   Sodium 864 - 854 mmol/L 132   Potassium 3.5 - 5.1 mmol/L 3.4   Chloride 98 - 111 mmol/L 102   CO2 22 - 32 mmol/L 19   Calcium 8.9 - 10.3 mg/dL 8.7   Total Protein 6.5 - 8.1 g/dL 6.7   Total Bilirubin 0.0 - 1.2 mg/dL 0.5   Alkaline Phos 38 - 126 U/L 98   AST 15 - 41 U/L 21   ALT 0 - 44 U/L 16    Edinburgh Score:    04/10/2019    7:30 PM  Edinburgh Postnatal Depression Scale Screening Tool  I have been able to laugh and see the funny side of things. 0   I have looked forward with enjoyment to things. 0   I have blamed  myself unnecessarily when things went wrong. 1   I have been anxious or worried for no good reason. 1   I have felt scared or panicky for no good reason. 0   Things have been getting on top of me. 1   I have been so unhappy that I have had difficulty sleeping. 0   I have felt sad or miserable. 1   I have been so unhappy that I have been crying. 1   The thought of harming myself has occurred to me. 0   Edinburgh Postnatal Depression Scale Total 5      Data saved with a previous flowsheet row definition      After visit meds:  Allergies as of 04/15/2024       Reactions   Metronidazole  Itching, Rash, Hives   Penicillins Hives   Did it involve swelling of the face/tongue/throat, SOB, or low BP? no Did it involve sudden or severe rash/hives, skin peeling, or any  reaction on the inside of your mouth or nose? yes Did you need to seek medical attention at a hospital or doctor's office? Yes When did it last happen?       If all above answers are NO, may proceed with cephalosporin use. Did it involve swelling of the face/tongue/throat, SOB, or low BP? no  Did it involve sudden or severe rash/hives, skin peeling, or any reaction on the inside of your mouth or nose? yes  Did you need to seek medical attention at a hospital or doctor's office? Yes  When did it last happen?        If all above answers are NO, may proceed with cephalosporin use.   Amoxicillin Hives        Medication List     STOP taking these medications    Blood Pressure Kit Devi   Cholecalciferol 50 MCG (2000 UT) Caps   cyclobenzaprine  10 MG tablet Commonly known as: FLEXERIL    Gojji Weight Scale Misc   scopolamine  1 MG/3DAYS Commonly known as: TRANSDERM-SCOP       TAKE these medications    acetaminophen  325 MG tablet Commonly known as: Tylenol  Take 2 tablets (650 mg total) by mouth every 6 (six) hours. What changed:  medication strength how much to take when to take this reasons to take this   ibuprofen  600 MG tablet Commonly known as: ADVIL  Take 1 tablet (600 mg total) by mouth every 6 (six) hours.   multivitamin-prenatal 27-0.8 MG Tabs tablet Take 1 tablet by mouth daily at 12 noon.         Discharge home in stable condition Infant Feeding: Breast and formula Infant Disposition:home with mother Discharge instruction: per After Visit Summary and Postpartum booklet. Activity: Advance as tolerated. Pelvic rest for 6 weeks.  Diet: routine diet Anticipated Birth Control: Unsure Postpartum Appointment:6 weeks Additional Postpartum F/U: none Future Appointments:No future appointments. Follow up Visit:  Follow-up Information     [facility]. Schedule an appointment as soon as possible for a visit in 6 week(s).    Specialty: Obstetrics and Gynecology Contact information: [address] [place]  [address] [phone]                    04/15/2024 Mercer KATHEE Peal, CNM

## 2024-04-20 ENCOUNTER — Other Ambulatory Visit: Payer: Self-pay

## 2024-04-20 ENCOUNTER — Encounter (HOSPITAL_COMMUNITY): Payer: Self-pay

## 2024-04-20 ENCOUNTER — Inpatient Hospital Stay (HOSPITAL_COMMUNITY): Admission: AD | Admit: 2024-04-20 | Discharge: 2024-04-20 | Disposition: A | Discharge status: Home / Self Care

## 2024-04-20 DIAGNOSIS — O165 Unspecified maternal hypertension, complicating the puerperium: Secondary | ICD-10-CM | POA: Diagnosis not present

## 2024-04-20 DIAGNOSIS — Z79899 Other long term (current) drug therapy: Secondary | ICD-10-CM | POA: Insufficient documentation

## 2024-04-20 LAB — CBC
HCT: 35.8 % — ABNORMAL LOW (ref 36.0–46.0)
Hemoglobin: 11.9 g/dL — ABNORMAL LOW (ref 12.0–15.0)
MCH: 28.5 pg (ref 26.0–34.0)
MCHC: 33.2 g/dL (ref 30.0–36.0)
MCV: 85.6 fL (ref 80.0–100.0)
Platelets: 320 K/uL (ref 150–400)
RBC: 4.18 MIL/uL (ref 3.87–5.11)
RDW: 15.1 % (ref 11.5–15.5)
WBC: 6.6 K/uL (ref 4.0–10.5)
nRBC: 0 % (ref 0.0–0.2)

## 2024-04-20 LAB — COMPREHENSIVE METABOLIC PANEL WITH GFR
ALT: 54 U/L — ABNORMAL HIGH (ref 0–44)
AST: 42 U/L — ABNORMAL HIGH (ref 15–41)
Albumin: 3.4 g/dL — ABNORMAL LOW (ref 3.5–5.0)
Alkaline Phosphatase: 103 U/L (ref 38–126)
Anion gap: 10 (ref 5–15)
BUN: 9 mg/dL (ref 6–20)
CO2: 24 mmol/L (ref 22–32)
Calcium: 9.1 mg/dL (ref 8.9–10.3)
Chloride: 105 mmol/L (ref 98–111)
Creatinine, Ser: 0.69 mg/dL (ref 0.44–1.00)
GFR, Estimated: >60 mL/min
Glucose, Bld: 81 mg/dL (ref 70–99)
Potassium: 3.8 mmol/L (ref 3.5–5.1)
Sodium: 139 mmol/L (ref 135–145)
Total Bilirubin: 0.2 mg/dL (ref 0.0–1.2)
Total Protein: 6.6 g/dL (ref 6.5–8.1)

## 2024-04-20 MED ORDER — POTASSIUM CHLORIDE CRYS ER 10 MEQ PO TBCR: 10.0000 meq | EXTENDED_RELEASE_TABLET | Freq: Two times a day (BID) | ORAL | 0 refills | Status: AC

## 2024-04-20 MED ORDER — LISINOPRIL 10 MG PO TABS: 10.0000 mg | ORAL_TABLET | Freq: Every day | ORAL | 0 refills | Status: AC

## 2024-04-20 MED ORDER — FUROSEMIDE 20 MG PO TABS: 20.0000 mg | ORAL_TABLET | Freq: Every day | ORAL | 0 refills | Status: AC

## 2024-04-20 NOTE — MAU Note (Signed)
 Teresa Velez is a 33 y.o. at Unknown here in MAU reporting: she was instructed to be seen in MAU secondary increased BP, has been monitoring BP's at home.  Denies visual disturbances and HA, endorses intermittent sharp epigastric pain.  Also reports is having tingling in bilateral hand and right foot.    S/P NVD 04/14/2024  LMP: NA Onset of complaint: Wednesday Pain score: 0 Vitals:   04/20/24 1352  BP: 130/73  Pulse: 71  Resp: 19  Temp: 98.7 F (37.1 C)  SpO2: 100%     FHT: NA  Lab orders placed from triage: None

## 2024-04-20 NOTE — MAU Provider Note (Signed)
 " History     CSN: [id number]  Arrival date and time: 04/20/24 1312   Event Date/Time   First Provider Initiated Contact with Patient 04/20/24 1426      Chief Complaint  Patient presents with   BP Evaluation   HPI [name] is a 33 y.o. year old [id number] female at 6 days postpartum s/p SVD who presents to MAU reporting swelling of hands and feet along with elevated BP. She has been monitoring her BP at home since her hands and feet were swollen. When she notified the OB office, they advised her to come to MAU for evaluation. She denies any H/A,dizziness, blurry vision. She reports occasional intermittent sharp epigastric pain, tingling in hands and RT foot. She is using hand pump to express breastmilk without difficulty. She has no diagnosis of cHTN or gHTN or PEC. No complications with pregnancy or delivery. She receives [facility] with [facility].    OB History     Gravida  4   Para  2   Term  2   Preterm  0   AB  2   Living  2      SAB  1   IAB  1   Ectopic  0   Multiple  0   Live Births  2           Past Medical History:  Diagnosis Date   Low grade squamous intraepithelial lesion (LGSIL) on cervicovaginal cytologic smear 01/26/2021   2022, normal cytology 10/2022     Migraines    STD (sexually transmitted disease)    chlamydia treated 2011, HSV 2   Trichomonal vaginitis 11/01/2014   Vaginal Pap smear, abnormal     Past Surgical History:  Procedure Laterality Date   COLPOSCOPY  5/09   CINI    Family History  Problem Relation Age of Onset   Hypertension Maternal Grandmother    Healthy Mother    Healthy Father     Social History[1]  Allergies: Allergies[2]  Medications Prior to Admission  Medication Sig Dispense Refill Last Dose/Taking   ibuprofen  (ADVIL ) 600 MG tablet Take 1 tablet (600 mg total) by mouth every 6 (six) hours. 30 tablet 0 04/19/2024   acetaminophen  (TYLENOL ) 325 MG tablet Take 2 tablets (650 mg total) by  mouth every 6 (six) hours.   04/18/2024   Prenatal Vit-Fe Fumarate-FA (MULTIVITAMIN-PRENATAL) 27-0.8 MG TABS tablet Take 1 tablet by mouth daily at 12 noon.   04/15/2024    Review of Systems Physical Exam   Patient Vitals for the past 24 hrs:  BP Temp Temp src Pulse Resp SpO2 Height Weight  04/20/24 1516 (!) 140/82 -- -- 65 -- -- -- --  04/20/24 1515 -- -- -- -- -- 100 % -- --  04/20/24 1510 -- -- -- -- -- 100 % -- --  04/20/24 1505 -- -- -- -- -- 99 % -- --  04/20/24 1501 (!) 142/82 -- -- 74 -- -- -- --  04/20/24 1500 -- -- -- -- -- 100 % -- --  04/20/24 1455 -- -- -- -- -- 100 % -- --  04/20/24 1450 -- -- -- -- -- 100 % -- --  04/20/24 1446 130/76 -- -- 72 -- -- -- --  04/20/24 1445 -- -- -- -- -- 100 % -- --  04/20/24 1440 -- -- -- -- -- 100 % -- --  04/20/24 1435 -- -- -- -- -- 100 % -- --  04/20/24 1431  139/79 -- -- 1 -- -- -- --  04/20/24 1430 -- -- -- -- -- 100 % -- --  04/20/24 1425 -- -- -- -- -- 100 % -- --  04/20/24 1420 -- -- -- -- -- 100 % -- --  04/20/24 1416 137/76 -- -- 69 -- -- -- --  04/20/24 1415 -- -- -- -- -- 99 % -- --  04/20/24 1410 -- -- -- -- -- 99 % -- --  04/20/24 1405 131/72 -- -- 69 -- 99 % -- --  04/20/24 1352 130/73 98.7 F (37.1 C) Oral 71 19 100 % -- --  04/20/24 1346 -- -- -- -- -- -- 5' 3 (1.6 m) 82.1 kg     Physical Exam  MAU Course  Procedures  MDM CCUA CCUA CBC CMP Serial BP's   Results for orders placed or performed during the hospital encounter of 04/20/24 (from the past 24 hours)  CBC     Status: Abnormal   Collection Time: 04/20/24  3:05 PM  Result Value Ref Range   WBC 6.6 4.0 - 10.5 K/uL   RBC 4.18 3.87 - 5.11 MIL/uL   Hemoglobin 11.9 (L) 12.0 - 15.0 g/dL   HCT 64.1 (L) 63.9 - 53.9 %   MCV 85.6 80.0 - 100.0 fL   MCH 28.5 26.0 - 34.0 pg   MCHC 33.2 30.0 - 36.0 g/dL   RDW 84.8 88.4 - 84.4 %   Platelets 320 150 - 400 K/uL   nRBC 0.0 0.0 - 0.2 %  Comprehensive metabolic panel with GFR     Status: Abnormal    Collection Time: 04/20/24  3:05 PM  Result Value Ref Range   Sodium 139 135 - 145 mmol/L   Potassium 3.8 3.5 - 5.1 mmol/L   Chloride 105 98 - 111 mmol/L   CO2 24 22 - 32 mmol/L   Glucose, Bld 81 70 - 99 mg/dL   BUN 9 6 - 20 mg/dL   Creatinine, Ser 9.30 0.44 - 1.00 mg/dL   Calcium 9.1 8.9 - 89.6 mg/dL   Total Protein 6.6 6.5 - 8.1 g/dL   Albumin 3.4 (L) 3.5 - 5.0 g/dL   AST 42 (H) 15 - 41 U/L   ALT 54 (H) 0 - 44 U/L   Alkaline Phosphatase 103 38 - 126 U/L   Total Bilirubin 0.2 0.0 - 1.2 mg/dL   GFR, Estimated >39 >39 mL/min   Anion gap 10 5 - 15      *Consult with Dr. Zina @ 1606 - notified of patient's complaints, assessments, and lab results. Recommended tx plan by Dr. Zina: d/c home with Rx for Lisinopril  10 mg every day, Lasix  20 mg every day x 5 days and K+ 10 mEq every day x 5 days. Patient to f/u with OB Monday or Tuesday of next week.   Assessment and Plan  1. Postpartum hypertension (Primary) - Prescription for: Lisinopril  10 mg po every day until instructed to d/c by OB provider - Prescription for: Lasix  20 mg po every day x 5 days - Prescription for: Potassium Chloride  10 mEq po every day x 5 days while using Lasix  - Instructed to stay well-hydrated - Return to MAU for BP >160/110 - Discharge home - Schedule an appt with [facility] on Monday or Tuesday of next week for BP re-check - Patient verbalized an understanding of the plan of care and agrees.    Ala Cart, CNM 04/20/2024, 2:26 PM      [1]  Social History Tobacco Use   Smoking status: Former    Types: Cigars   Smokeless tobacco: Never  Vaping Use   Vaping status: Former  Substance Use Topics   Alcohol use: Not Currently   Drug use: Never  [2]  Allergies Allergen Reactions   Metronidazole  Itching, Rash and Hives   Penicillins Hives    Did it involve swelling of the face/tongue/throat, SOB, or low BP? no  Did it involve sudden or severe rash/hives, skin peeling, or any reaction on the inside of  your mouth or nose? yes  Did you need to seek medical attention at a hospital or doctor's office? Yes  When did it last happen?        If all above answers are NO, may proceed with cephalosporin use.  Did it involve swelling of the face/tongue/throat, SOB, or low BP? no  Did it involve sudden or severe rash/hives, skin peeling, or any reaction on the inside of your mouth or nose? yes  Did you need to seek medical attention at a hospital or doctor's office? Yes  When did it last happen?        If all above answers are NO, may proceed with cephalosporin use.   Amoxicillin Hives   "

## 2024-04-24 ENCOUNTER — Telehealth (HOSPITAL_COMMUNITY): Payer: Self-pay | Admitting: *Deleted

## 2024-04-24 NOTE — Telephone Encounter (Signed)
 04/24/2024  Name: Teresa Velez MRN: [id number] DOB: December 14, 1990  Reason for Call:  Transition of Care Hospital Discharge Call  Contact Status: Patient Contact Status: Complete  Language assistant needed:          Follow-Up Questions: Do You Have Any Concerns About Your Health As You Heal From Delivery?: Yes What Concerns Do You Have About Your Health?: Patient voiced questions about labwork done from recent hospitalization. Also reported pain on her (L) side when I cough or sneeze. Stated pain has increased in the last day. RN instructed patient to call her OB office to discuss her concerns, and to come to MAU if needed to be evaluated for pain. Patient verbalized understanding. Voiced no other questions or concerns at this time. Do You Have Any Concerns About Your Infants Health?: Yes What Concerns Do You Have About Your Baby?: Patient reported that infant has been crying more than usual. Stated she observed some bleeding when infant's umbilical cord fell off. And voiced concern that infant's eyes look yellow. RN instructed patient that a small amount of bleeding is normal when umbilical cord falls off. RN reviewed warning signs to report to infant's pediatrician. Also instructed patient to keep umbilical cord site clean and dry until site has completely healed. Patient voiced understanding. RN instructed patient to call pediatrician regarding concerns for prolonged periods of crying and for reports of yellowing eyes. Patient verbalized understanding.  Edinburgh Postnatal Depression Scale:  In the Past 7 Days: I have been able to laugh and see the funny side of things.: As much as I always could I have looked forward with enjoyment to things.: Definitely less than I used to I have blamed myself unnecessarily when things went wrong.: Yes, some of the time I have been anxious or worried for no good reason.: Yes, sometimes I have felt scared or panicky for no good reason.: No, not at  all Things have been getting on top of me.: No, most of the time I have coped quite well I have been so unhappy that I have had difficulty sleeping.: Yes, most of the time I have felt sad or miserable.: Yes, most of the time I have been so unhappy that I have been crying.: Only occasionally The thought of harming myself has occurred to me.: Never [place] Postnatal Depression Scale Total: (!) 14 RN faxed EPDS results to Mercer Peal, CNM, with [facility] for her review. Patient also agreed for RN to email her a list of maternal mental health resources - sent by RN.  PHQ2-9 Depression Scale:     Discharge Follow-up: Edinburgh score requires follow up?: Yes Provider notified of Edinburgh score?: Yes Have you already been referred for a counseling appointment?: No Patient was advised of the following resources:: Breastfeeding Support Group, Support Group Patient referred to:: OB, Peds, [facility] Requested email information on hospital support groups - sent by RN.  Post-discharge interventions: Reviewed Newborn Safe Sleep Practices Maternal Mental Health Resources provided  Signature Allean IVAR Carton, RN, 04/24/24, [phone]
